# Patient Record
Sex: Female | Born: 1990 | Hispanic: Yes | Marital: Married | Smoking: Never smoker
Health system: Southern US, Community
[De-identification: ages and names within clinical notes are randomized; demographics above are authoritative.]

## PROBLEM LIST (undated history)

## (undated) ENCOUNTER — Inpatient Hospital Stay (HOSPITAL_COMMUNITY): Payer: Self-pay

## (undated) DIAGNOSIS — Z789 Other specified health status: Secondary | ICD-10-CM

## (undated) HISTORY — PX: APPENDECTOMY: SHX54

---

## 2014-01-21 LAB — OB RESULTS CONSOLE ABO/RH: RH TYPE: POSITIVE

## 2014-01-21 LAB — OB RESULTS CONSOLE HEPATITIS B SURFACE ANTIGEN: Hepatitis B Surface Ag: NEGATIVE

## 2014-01-21 LAB — OB RESULTS CONSOLE GC/CHLAMYDIA
CHLAMYDIA, DNA PROBE: NEGATIVE
Gonorrhea: NEGATIVE

## 2014-01-21 LAB — OB RESULTS CONSOLE RPR: RPR: NONREACTIVE

## 2014-01-21 LAB — OB RESULTS CONSOLE RUBELLA ANTIBODY, IGM: RUBELLA: IMMUNE

## 2014-01-21 LAB — OB RESULTS CONSOLE ANTIBODY SCREEN: ANTIBODY SCREEN: NEGATIVE

## 2014-01-21 LAB — OB RESULTS CONSOLE HIV ANTIBODY (ROUTINE TESTING): HIV: NONREACTIVE

## 2014-04-16 NOTE — L&D Delivery Note (Signed)
Delivery Note At 3:03 AM a viable female "Lori Sanford" was delivered via Vaginal, Spontaneous Delivery (Presentation: OA restituting to Left Occiput Anterior).  APGARS: 9, 9; weight pending.   Placenta status: Intact, Spontaneous.  Cord: 3 vessels with the following complications: None.  Cord pH: NA  Anesthesia: Epidural  Episiotomy: None Lacerations: 2nd degree Vaginal Suture Repair: 3.0 vicryl Est. Blood Loss (mL): 500   Mom to postpartum.  Baby to Couplet care / Skin to Skin.  Mom plans to breast and bottlefeed.  Undecided re: circumcision.  Undecided re: birth control.  Continuous trickle noted after repair but not enough for agent other than Pitocin; however, will place on Methergine series x 24 hrs for prophylaxis.  Lori Sanford, Lori Sanford 08/20/2014, 4:04 AM

## 2014-04-21 ENCOUNTER — Encounter (HOSPITAL_COMMUNITY): Payer: Self-pay | Admitting: *Deleted

## 2014-04-21 ENCOUNTER — Inpatient Hospital Stay (HOSPITAL_COMMUNITY)
Admission: AD | Admit: 2014-04-21 | Discharge: 2014-04-21 | Disposition: A | Discharge status: Home / Self Care | Payer: Medicaid Other | Source: Ambulatory Visit | Attending: Obstetrics and Gynecology | Admitting: Obstetrics and Gynecology

## 2014-04-21 DIAGNOSIS — O9989 Other specified diseases and conditions complicating pregnancy, childbirth and the puerperium: Secondary | ICD-10-CM | POA: Diagnosis present

## 2014-04-21 DIAGNOSIS — Z3A21 21 weeks gestation of pregnancy: Secondary | ICD-10-CM | POA: Diagnosis not present

## 2014-04-21 DIAGNOSIS — M545 Low back pain: Secondary | ICD-10-CM | POA: Insufficient documentation

## 2014-04-21 DIAGNOSIS — R51 Headache: Secondary | ICD-10-CM | POA: Diagnosis not present

## 2014-04-21 DIAGNOSIS — N898 Other specified noninflammatory disorders of vagina: Secondary | ICD-10-CM | POA: Diagnosis not present

## 2014-04-21 DIAGNOSIS — G44219 Episodic tension-type headache, not intractable: Secondary | ICD-10-CM | POA: Diagnosis present

## 2014-04-21 LAB — AMNISURE RUPTURE OF MEMBRANE (ROM) NOT AT ARMC: Amnisure ROM: NEGATIVE

## 2014-04-21 MED ORDER — CYCLOBENZAPRINE HCL 10 MG PO TABS: 10.0000 mg | ORAL_TABLET | Freq: Three times a day (TID) | ORAL | Status: DC | PRN

## 2014-04-21 NOTE — MAU Note (Signed)
Regular appt, had one time woke up and panties were wet last week.  Some wetness, but not like last week.  Dr Stefano GaulStringer had called regarding pt.

## 2014-04-21 NOTE — MAU Provider Note (Signed)
History   24 yo [id number] at 4421 1/7 weeks [facility] with report of leaking periodically.  Nitrazene negative, pooling negative, fern positive, sent to MAU for amnisure.  No active leaking at present, no pain.  Does report issue with sporadic frontal HAs and low back pain.  Patient Active Problem List   Diagnosis Date Noted  . Vaginal discharge during pregnancy in second trimester 04/21/2014  . Low back pain during pregnancy 04/21/2014  . Headache, infrequent episodic tension-type 04/21/2014    Chief Complaint  Patient presents with  . Vaginal Discharge   HPI:  See above  OB History    Gravida Para Term Preterm AB TAB SAB Ectopic Multiple Living   4 2 2  1  1   2       History reviewed. No pertinent past medical history.  History reviewed. No pertinent past surgical history.  History reviewed. No pertinent family history.  History  Substance Use Topics  . Smoking status: Never Smoker   . Smokeless tobacco: Not on file  . Alcohol Use: Not on file    Allergies: No Known Allergies  No prescriptions prior to admission    ROS:  Sporadic vaginal  Physical Exam   Blood pressure 109/65, pulse 85, temperature 98.5 F (36.9 C), temperature source Oral, resp. rate 18, weight 180 lb (81.647 kg).  Physical Exam  In NAD Chest clear Heart RRR without murmur Abd gravid, NT Pelvic--deferred.  Amnisure collected Ext WNL  FHR 150 bpm No contractions  Amnisure negative  ED Course  Assessment: IUP at 21 1/7 weeks Vaginal d/c, no evidence SROM Hx HA, low back pain.  Plan: D/C home. Rx Flexeril for tension HA and low back pain. Keep scheduled appt at [facility] in 4 weeks, call prn.   Nigel BridgemanLATHAM, Maddox Bratcher CNM, MSN 04/21/2014 3:04 PM

## 2014-04-21 NOTE — Discharge Instructions (Signed)
Back Pain in Pregnancy °Back pain during pregnancy is common. It happens in about half of all pregnancies. It is important for you and your baby that you remain active during your pregnancy. If you feel that back pain is not allowing you to remain active or sleep well, it is time to see your caregiver. Back pain may be caused by several factors related to changes during your pregnancy. Fortunately, unless you had trouble with your back before your pregnancy, the pain is likely to get better after you deliver. °Low back pain usually occurs between the fifth and seventh months of pregnancy. It can, however, happen in the first couple months. Factors that increase the risk of back problems include:  °· Previous back problems. °· Injury to your back. °· Having twins or multiple births. °· A chronic cough. °· Stress. °· Job-related repetitive motions. °· Muscle or spinal disease in the back. °· Family history of back problems, ruptured (herniated) discs, or osteoporosis. °· Depression, anxiety, and panic attacks. °CAUSES  °· When you are pregnant, your body produces a hormone called relaxin. This hormone makes the ligaments connecting the low back and pubic bones more flexible. This flexibility allows the baby to be delivered more easily. When your ligaments are loose, your muscles need to work harder to support your back. Soreness in your back can come from tired muscles. Soreness can also come from back tissues that are irritated since they are receiving less support. °· As the baby grows, it puts pressure on the nerves and blood vessels in your pelvis. This can cause back pain. °· As the baby grows and gets heavier during pregnancy, the uterus pushes the stomach muscles forward and changes your center of gravity. This makes your back muscles work harder to maintain good posture. °SYMPTOMS  °Lumbar pain during pregnancy °Lumbar pain during pregnancy usually occurs at or above the waist in the center of the back. There  may be pain and numbness that radiates into your leg or foot. This is similar to low back pain experienced by non-pregnant women. It usually increases with sitting for long periods of time, standing, or repetitive lifting. Tenderness may also be present in the muscles along your upper back. °Posterior pelvic pain during pregnancy °Pain in the back of the pelvis is more common than lumbar pain in pregnancy. It is a deep pain felt in your side at the waistline, or across the tailbone (sacrum), or in both places. You may have pain on one or both sides. This pain can also go into the buttocks and backs of the upper thighs. Pubic and groin pain may also be present. The pain does not quickly resolve with rest, and morning stiffness may also be present. °Pelvic pain during pregnancy can be brought on by most activities. A high level of fitness before and during pregnancy may or may not prevent this problem. Labor pain is usually 1 to 2 minutes apart, lasts for about 1 minute, and involves a bearing down feeling or pressure in your pelvis. However, if you are at term with the pregnancy, constant low back pain can be the beginning of early labor, and you should be aware of this. °DIAGNOSIS  °X-rays of the back should not be done during the first 12 to 14 weeks of the pregnancy and only when absolutely necessary during the rest of the pregnancy. MRIs do not give off radiation and are safe during pregnancy. MRIs also should only be done when absolutely necessary. °HOME CARE INSTRUCTIONS °· Exercise   as directed by your caregiver. Exercise is the most effective way to prevent or manage back pain. If you have a back problem, it is especially important to avoid sports that require sudden body movements. Swimming and walking are great activities. °· Do not stand in one place for long periods of time. °· Do not wear high heels. °· Sit in chairs with good posture. Use a pillow on your lower back if necessary. Make sure your head  rests over your shoulders and is not hanging forward. °· Try sleeping on your side, preferably the left side, with a pillow or two between your legs. If you are sore after a night's rest, your bed may be too soft. Try placing a board between your mattress and box spring. °· Listen to your body when lifting. If you are experiencing pain, ask for help or try bending your knees more so you can use your leg muscles rather than your back muscles. Squat down when picking up something from the floor. Do not bend over. °· Eat a healthy diet. Try to gain weight within your caregiver's recommendations. °· Use heat or cold packs 3 to 4 times a day for 15 minutes to help with the pain. °· Only take over-the-counter or prescription medicines for pain, discomfort, or fever as directed by your caregiver. °Sudden (acute) back pain °· Use bed rest for only the most extreme, acute episodes of back pain. Prolonged bed rest over 48 hours will aggravate your condition. °· Ice is very effective for acute conditions. °¨ Put ice in a plastic bag. °¨ Place a towel between your skin and the bag. °¨ Leave the ice on for 10 to 20 minutes every 2 hours, or as needed. °· Using heat packs for 30 minutes prior to activities is also helpful. °Continued back pain °See your caregiver if you have continued problems. Your caregiver can help or refer you for appropriate physical therapy. With conditioning, most back problems can be avoided. Sometimes, a more serious issue may be the cause of back pain. You should be seen right away if new problems seem to be developing. Your caregiver may recommend: °· A maternity girdle. °· An elastic sling. °· A back brace. °· A massage therapist or acupuncture. °SEEK MEDICAL CARE IF:  °· You are not able to do most of your daily activities, even when taking the pain medicine you were given. °· You need a referral to a physical therapist or chiropractor. °· You want to try acupuncture. °SEEK IMMEDIATE MEDICAL CARE  IF: °· You develop numbness, tingling, weakness, or problems with the use of your arms or legs. °· You develop severe back pain that is no longer relieved with medicines. °· You have a sudden change in bowel or bladder control. °· You have increasing pain in other areas of the body. °· You develop shortness of breath, dizziness, or fainting. °· You develop nausea, vomiting, or sweating. °· You have back pain which is similar to labor pains. °· You have back pain along with your water breaking or vaginal bleeding. °· You have back pain or numbness that travels down your leg. °· Your back pain developed after you fell. °· You develop pain on one side of your back. You may have a kidney stone. °· You see blood in your urine. You may have a bladder infection or kidney stone. °· You have back pain with blisters. You may have shingles. °Back pain is fairly common during pregnancy but should not be accepted as just part of   the process. Back pain should always be treated as soon as possible. This will make your pregnancy as pleasant as possible. Document Released: 07/11/2005 Document Revised: 06/25/2011 Document Reviewed: 08/22/2010 [facility] Patient Information 2015 [place], [place]. This information is not intended to replace advice given to you by your health care provider. Make sure you discuss any questions you have with your health care provider.  Tension Headache A tension headache is a feeling of pain, pressure, or aching often felt over the front and sides of the head. The pain can be dull or can feel tight (constricting). It is the most common type of headache. Tension headaches are not normally associated with nausea or vomiting and do not get worse with physical activity. Tension headaches can last 30 minutes to several days.  CAUSES  The exact cause is not known, but it may be caused by chemicals and hormones in the brain that lead to pain. Tension headaches often begin after stress, anxiety, or  depression. Other triggers may include:  Alcohol.  Caffeine (too much or withdrawal).  Respiratory infections (colds, flu, sinus infections).  Dental problems or teeth clenching.  Fatigue.  Holding your head and neck in one position too long while using a computer. SYMPTOMS   Pressure around the head.   Dull, aching head pain.   Pain felt over the front and sides of the head.   Tenderness in the muscles of the head, neck, and shoulders. DIAGNOSIS  A tension headache is often diagnosed based on:   Symptoms.   Physical examination.   A CT scan or MRI of your head. These tests may be ordered if symptoms are severe or unusual. TREATMENT  Medicines may be given to help relieve symptoms.  HOME CARE INSTRUCTIONS   Only take over-the-counter or prescription medicines for pain or discomfort as directed by your caregiver.   Lie down in a dark, quiet room when you have a headache.   Keep a journal to find out what may be triggering your headaches. For example, write down:  What you eat and drink.  How much sleep you get.  Any change to your diet or medicines.  Try massage or other relaxation techniques.   Ice packs or heat applied to the head and neck can be used. Use these 3 to 4 times per day for 15 to 20 minutes each time, or as needed.   Limit stress.   Sit up straight, and do not tense your muscles.   Quit smoking if you smoke.  Limit alcohol use.  Decrease the amount of caffeine you drink, or stop drinking caffeine.  Eat and exercise regularly.  Get 7 to 9 hours of sleep, or as recommended by your caregiver.  Avoid excessive use of pain medicine as recurrent headaches can occur.  SEEK MEDICAL CARE IF:   You have problems with the medicines you were prescribed.  Your medicines do not work.  You have a change from the usual headache.  You have nausea or vomiting. SEEK IMMEDIATE MEDICAL CARE IF:   Your headache becomes severe.  You  have a fever.  You have a stiff neck.  You have loss of vision.  You have muscular weakness or loss of muscle control.  You lose your balance or have trouble walking.  You feel faint or pass out.  You have severe symptoms that are different from your first symptoms. MAKE SURE YOU:   Understand these instructions.  Will watch your condition.  Will get help right  away if you are not doing well or get worse. Document Released: 04/02/2005 Document Revised: 06/25/2011 Document Reviewed: 03/23/2011 [facility] Patient Information 2015 [place]. This information is not intended to replace advice given to you by your health care provider. Make sure you discuss any questions you have with your health care provider.

## 2014-04-21 NOTE — MAU Note (Signed)
Patient urine sent to lab.

## 2014-05-22 ENCOUNTER — Encounter (HOSPITAL_COMMUNITY): Payer: Self-pay

## 2014-05-22 ENCOUNTER — Inpatient Hospital Stay (HOSPITAL_COMMUNITY)
Admission: AD | Admit: 2014-05-22 | Discharge: 2014-05-22 | Disposition: A | Discharge status: Home / Self Care | Payer: Medicaid Other | Source: Ambulatory Visit | Attending: Obstetrics and Gynecology | Admitting: Obstetrics and Gynecology

## 2014-05-22 DIAGNOSIS — Z3A25 25 weeks gestation of pregnancy: Secondary | ICD-10-CM | POA: Diagnosis not present

## 2014-05-22 DIAGNOSIS — N760 Acute vaginitis: Secondary | ICD-10-CM | POA: Insufficient documentation

## 2014-05-22 DIAGNOSIS — O23592 Infection of other part of genital tract in pregnancy, second trimester: Secondary | ICD-10-CM | POA: Insufficient documentation

## 2014-05-22 DIAGNOSIS — M545 Low back pain: Secondary | ICD-10-CM | POA: Diagnosis present

## 2014-05-22 DIAGNOSIS — B9689 Other specified bacterial agents as the cause of diseases classified elsewhere: Secondary | ICD-10-CM | POA: Insufficient documentation

## 2014-05-22 DIAGNOSIS — R109 Unspecified abdominal pain: Secondary | ICD-10-CM | POA: Insufficient documentation

## 2014-05-22 HISTORY — DX: Other specified health status: Z78.9

## 2014-05-22 LAB — URINALYSIS, ROUTINE W REFLEX MICROSCOPIC
Bilirubin Urine: NEGATIVE
Glucose, UA: NEGATIVE mg/dL
Hgb urine dipstick: NEGATIVE
Ketones, ur: NEGATIVE mg/dL
LEUKOCYTES UA: NEGATIVE
Nitrite: NEGATIVE
Protein, ur: NEGATIVE mg/dL
Specific Gravity, Urine: 1.01 (ref 1.005–1.030)
Urobilinogen, UA: 0.2 mg/dL (ref 0.0–1.0)
pH: 6.5 (ref 5.0–8.0)

## 2014-05-22 LAB — FETAL FIBRONECTIN: FETAL FIBRONECTIN: NEGATIVE

## 2014-05-22 LAB — WET PREP, GENITAL
TRICH WET PREP: NONE SEEN
Yeast Wet Prep HPF POC: NONE SEEN

## 2014-05-22 MED ORDER — IBUPROFEN 600 MG PO TABS: 600.0000 mg | ORAL_TABLET | Freq: Four times a day (QID) | ORAL | Status: DC | PRN

## 2014-05-22 MED ORDER — IBUPROFEN 800 MG PO TABS
800.0000 mg | ORAL_TABLET | Freq: Once | ORAL | Status: AC
  Administered 2014-05-22: 800 mg via ORAL
  Filled 2014-05-22: qty 1

## 2014-05-22 MED ORDER — METRONIDAZOLE 500 MG PO TABS: 500.0000 mg | ORAL_TABLET | Freq: Two times a day (BID) | ORAL | Status: AC

## 2014-05-22 NOTE — MAU Note (Signed)
Urine in lab 

## 2014-05-22 NOTE — MAU Note (Signed)
Abd pain and back pain for 3 days. Some bleeding last Sunday but stopped. Leaked some clear fld this am but none since

## 2014-05-22 NOTE — Discharge Instructions (Signed)
Back Pain in Pregnancy °Back pain during pregnancy is common. It happens in about half of all pregnancies. It is important for you and your baby that you remain active during your pregnancy. If you feel that back pain is not allowing you to remain active or sleep well, it is time to see your caregiver. Back pain may be caused by several factors related to changes during your pregnancy. Fortunately, unless you had trouble with your back before your pregnancy, the pain is likely to get better after you deliver. °Low back pain usually occurs between the fifth and seventh months of pregnancy. It can, however, happen in the first couple months. Factors that increase the risk of back problems include:  °· Previous back problems. °· Injury to your back. °· Having twins or multiple births. °· A chronic cough. °· Stress. °· Job-related repetitive motions. °· Muscle or spinal disease in the back. °· Family history of back problems, ruptured (herniated) discs, or osteoporosis. °· Depression, anxiety, and panic attacks. °CAUSES  °· When you are pregnant, your body produces a hormone called relaxin. This hormone makes the ligaments connecting the low back and pubic bones more flexible. This flexibility allows the baby to be delivered more easily. When your ligaments are loose, your muscles need to work harder to support your back. Soreness in your back can come from tired muscles. Soreness can also come from back tissues that are irritated since they are receiving less support. °· As the baby grows, it puts pressure on the nerves and blood vessels in your pelvis. This can cause back pain. °· As the baby grows and gets heavier during pregnancy, the uterus pushes the stomach muscles forward and changes your center of gravity. This makes your back muscles work harder to maintain good posture. °SYMPTOMS  °Lumbar pain during pregnancy °Lumbar pain during pregnancy usually occurs at or above the waist in the center of the back. There  may be pain and numbness that radiates into your leg or foot. This is similar to low back pain experienced by non-pregnant women. It usually increases with sitting for long periods of time, standing, or repetitive lifting. Tenderness may also be present in the muscles along your upper back. °Posterior pelvic pain during pregnancy °Pain in the back of the pelvis is more common than lumbar pain in pregnancy. It is a deep pain felt in your side at the waistline, or across the tailbone (sacrum), or in both places. You may have pain on one or both sides. This pain can also go into the buttocks and backs of the upper thighs. Pubic and groin pain may also be present. The pain does not quickly resolve with rest, and morning stiffness may also be present. °Pelvic pain during pregnancy can be brought on by most activities. A high level of fitness before and during pregnancy may or may not prevent this problem. Labor pain is usually 1 to 2 minutes apart, lasts for about 1 minute, and involves a bearing down feeling or pressure in your pelvis. However, if you are at term with the pregnancy, constant low back pain can be the beginning of early labor, and you should be aware of this. °DIAGNOSIS  °X-rays of the back should not be done during the first 12 to 14 weeks of the pregnancy and only when absolutely necessary during the rest of the pregnancy. MRIs do not give off radiation and are safe during pregnancy. MRIs also should only be done when absolutely necessary. °HOME CARE INSTRUCTIONS °· Exercise   as directed by your caregiver. Exercise is the most effective way to prevent or manage back pain. If you have a back problem, it is especially important to avoid sports that require sudden body movements. Swimming and walking are great activities. °· Do not stand in one place for long periods of time. °· Do not wear high heels. °· Sit in chairs with good posture. Use a pillow on your lower back if necessary. Make sure your head  rests over your shoulders and is not hanging forward. °· Try sleeping on your side, preferably the left side, with a pillow or two between your legs. If you are sore after a night's rest, your bed may be too soft. Try placing a board between your mattress and box spring. °· Listen to your body when lifting. If you are experiencing pain, ask for help or try bending your knees more so you can use your leg muscles rather than your back muscles. Squat down when picking up something from the floor. Do not bend over. °· Eat a healthy diet. Try to gain weight within your caregiver's recommendations. °· Use heat or cold packs 3 to 4 times a day for 15 minutes to help with the pain. °· Only take over-the-counter or prescription medicines for pain, discomfort, or fever as directed by your caregiver. °Sudden (acute) back pain °· Use bed rest for only the most extreme, acute episodes of back pain. Prolonged bed rest over 48 hours will aggravate your condition. °· Ice is very effective for acute conditions. °¨ Put ice in a plastic bag. °¨ Place a towel between your skin and the bag. °¨ Leave the ice on for 10 to 20 minutes every 2 hours, or as needed. °· Using heat packs for 30 minutes prior to activities is also helpful. °Continued back pain °See your caregiver if you have continued problems. Your caregiver can help or refer you for appropriate physical therapy. With conditioning, most back problems can be avoided. Sometimes, a more serious issue may be the cause of back pain. You should be seen right away if new problems seem to be developing. Your caregiver may recommend: °· A maternity girdle. °· An elastic sling. °· A back brace. °· A massage therapist or acupuncture. °SEEK MEDICAL CARE IF:  °· You are not able to do most of your daily activities, even when taking the pain medicine you were given. °· You need a referral to a physical therapist or chiropractor. °· You want to try acupuncture. °SEEK IMMEDIATE MEDICAL CARE  IF: °· You develop numbness, tingling, weakness, or problems with the use of your arms or legs. °· You develop severe back pain that is no longer relieved with medicines. °· You have a sudden change in bowel or bladder control. °· You have increasing pain in other areas of the body. °· You develop shortness of breath, dizziness, or fainting. °· You develop nausea, vomiting, or sweating. °· You have back pain which is similar to labor pains. °· You have back pain along with your water breaking or vaginal bleeding. °· You have back pain or numbness that travels down your leg. °· Your back pain developed after you fell. °· You develop pain on one side of your back. You may have a kidney stone. °· You see blood in your urine. You may have a bladder infection or kidney stone. °· You have back pain with blisters. You may have shingles. °Back pain is fairly common during pregnancy but should not be accepted as just part of   the process. Back pain should always be treated as soon as possible. This will make your pregnancy as pleasant as possible. Document Released: 07/11/2005 Document Revised: 06/25/2011 Document Reviewed: 08/22/2010 [facility] Patient Information 2015 [place], [place]. This information is not intended to replace advice given to you by your health care provider. Make sure you discuss any questions you have with your health care provider. Abdominal Pain During Pregnancy Abdominal pain is common in pregnancy. Most of the time, it does not cause harm. There are many causes of abdominal pain. Some causes are more serious than others. Some of the causes of abdominal pain in pregnancy are easily diagnosed. Occasionally, the diagnosis takes time to understand. Other times, the cause is not determined. Abdominal pain can be a sign that something is very wrong with the pregnancy, or the pain may have nothing to do with the pregnancy at all. For this reason, always tell your health care provider if you have any  abdominal discomfort. HOME CARE INSTRUCTIONS  Monitor your abdominal pain for any changes. The following actions may help to alleviate any discomfort you are experiencing:  Do not have sexual intercourse or put anything in your vagina until your symptoms go away completely.  Get plenty of rest until your pain improves.  Drink clear fluids if you feel nauseous. Avoid solid food as long as you are uncomfortable or nauseous.  Only take over-the-counter or prescription medicine as directed by your health care provider.  Keep all follow-up appointments with your health care provider. SEEK IMMEDIATE MEDICAL CARE IF:  You are bleeding, leaking fluid, or passing tissue from the vagina.  You have increasing pain or cramping.  You have persistent vomiting.  You have painful or bloody urination.  You have a fever.  You notice a decrease in your baby's movements.  You have extreme weakness or feel faint.  You have shortness of breath, with or without abdominal pain.  You develop a severe headache with abdominal pain.  You have abnormal vaginal discharge with abdominal pain.  You have persistent diarrhea.  You have abdominal pain that continues even after rest, or gets worse. MAKE SURE YOU:   Understand these instructions.  Will watch your condition.  Will get help right away if you are not doing well or get worse. Document Released: 04/02/2005 Document Revised: 01/21/2013 Document Reviewed: 10/30/2012 [facility] Patient Information 2015 [place], [place]. This information is not intended to replace advice given to you by your health care provider. Make sure you discuss any questions you have with your health care provider. Bacterial Vaginosis Bacterial vaginosis is a vaginal infection that occurs when the normal balance of bacteria in the vagina is disrupted. It results from an overgrowth of certain bacteria. This is the most common vaginal infection in women of childbearing age.  Treatment is important to prevent complications, especially in pregnant women, as it can cause a premature delivery. CAUSES  Bacterial vaginosis is caused by an increase in harmful bacteria that are normally present in smaller amounts in the vagina. Several different kinds of bacteria can cause bacterial vaginosis. However, the reason that the condition develops is not fully understood. RISK FACTORS Certain activities or behaviors can put you at an increased risk of developing bacterial vaginosis, including:  Having a new sex partner or multiple sex partners.  Douching.  Using an intrauterine device (IUD) for contraception. Women do not get bacterial vaginosis from toilet seats, bedding, swimming pools, or contact with objects around them. SIGNS AND SYMPTOMS  Some women  with bacterial vaginosis have no signs or symptoms. Common symptoms include:  Grey vaginal discharge.  A fishlike odor with discharge, especially after sexual intercourse.  Itching or burning of the vagina and vulva.  Burning or pain with urination. DIAGNOSIS  Your health care provider will take a medical history and examine the vagina for signs of bacterial vaginosis. A sample of vaginal fluid may be taken. Your health care provider will look at this sample under a microscope to check for bacteria and abnormal cells. A vaginal pH test may also be done.  TREATMENT  Bacterial vaginosis may be treated with antibiotic medicines. These may be given in the form of a pill or a vaginal cream. A second round of antibiotics may be prescribed if the condition comes back after treatment.  HOME CARE INSTRUCTIONS   Only take over-the-counter or prescription medicines as directed by your health care provider.  If antibiotic medicine was prescribed, take it as directed. Make sure you finish it even if you start to feel better.  Do not have sex until treatment is completed.  Tell all sexual partners that you have a vaginal  infection. They should see their health care provider and be treated if they have problems, such as a mild rash or itching.  Practice safe sex by using condoms and only having one sex partner. SEEK MEDICAL CARE IF:   Your symptoms are not improving after 3 days of treatment.  You have increased discharge or pain.  You have a fever. MAKE SURE YOU:   Understand these instructions.  Will watch your condition.  Will get help right away if you are not doing well or get worse. FOR MORE INFORMATION  Centers for Disease Control and Prevention, Division of STD Prevention: SolutionApps.co.zawww.cdc.gov/std American Sexual Health Association (ASHA): www.ashastd.org  Document Released: 04/02/2005 Document Revised: 01/21/2013 Document Reviewed: 11/12/2012 [facility] Patient Information 2015 [place]. This information is not intended to replace advice given to you by your health care provider. Make sure you discuss any questions you have with your health care provider.

## 2014-05-22 NOTE — MAU Provider Note (Signed)
History  Lori Sanford is a 24 yo [id number] @ 25.4 wks who presents to MAU after calling w/ c/o low back pain and intermittent low abdominal tightening, especially when sitting and walking. Reports increase in discharge w/o odor and good FM. Denies VB. Last intercourse 1 week ago. Has struggled with back pain since onset of pregnancy. Was prescribed exercises for low back pain in pregnancy and Flexeril which she states is not working. Patient Active Problem List   Diagnosis Date Noted  . Abdominal pain 05/22/2014  . Vaginal discharge during pregnancy in second trimester 04/21/2014  . Low back pain during pregnancy 04/21/2014  . Headache, infrequent episodic tension-type 04/21/2014    Chief Complaint  Patient presents with  . Abdominal Pain  . Back Pain   HPI As above OB History    Gravida Para Term Preterm AB TAB SAB Ectopic Multiple Living   4 2 2  1  1   2       Past Medical History  Diagnosis Date  . Medical history non-contributory     Past Surgical History  Procedure Laterality Date  . No past surgeries      Family History  Problem Relation Age of Onset  . Stroke Mother   . Hypertension Mother   . Diabetes Father     History  Substance Use Topics  . Smoking status: Never Smoker   . Smokeless tobacco: Never Used  . Alcohol Use: No    Allergies: No Known Allergies  Prescriptions prior to admission  Medication Sig Dispense Refill Last Dose  . Prenatal Vit-Fe Fumarate-FA (PRENATAL MULTIVITAMIN) TABS tablet Take 1 tablet by mouth daily at 12 noon.   05/21/2014 at Unknown time  . cyclobenzaprine (FLEXERIL) 10 MG tablet Take 1 tablet (10 mg total) by mouth 3 (three) times daily as needed for muscle spasms. 30 tablet 0     ROS  Abdominal pain Back pain Physical Exam   Results for orders placed or performed during the hospital encounter of 05/22/14 (from the past 24 hour(s))  Urinalysis, Routine w reflex microscopic     Status: None   Collection Time: 05/22/14  12:30 AM  Result Value Ref Range   Color, Urine YELLOW YELLOW   APPearance CLEAR CLEAR   Specific Gravity, Urine 1.010 1.005 - 1.030   pH 6.5 5.0 - 8.0   Glucose, UA NEGATIVE NEGATIVE mg/dL   Hgb urine dipstick NEGATIVE NEGATIVE   Bilirubin Urine NEGATIVE NEGATIVE   Ketones, ur NEGATIVE NEGATIVE mg/dL   Protein, ur NEGATIVE NEGATIVE mg/dL   Urobilinogen, UA 0.2 0.0 - 1.0 mg/dL   Nitrite NEGATIVE NEGATIVE   Leukocytes, UA NEGATIVE NEGATIVE  Fetal fibronectin     Status: None   Collection Time: 05/22/14  1:08 AM  Result Value Ref Range   Fetal Fibronectin NEGATIVE NEGATIVE  Wet prep, genital     Status: Abnormal   Collection Time: 05/22/14  1:08 AM  Result Value Ref Range   Yeast Wet Prep HPF POC NONE SEEN NONE SEEN   Trich, Wet Prep NONE SEEN NONE SEEN   Clue Cells Wet Prep HPF POC FEW (A) NONE SEEN   WBC, Wet Prep HPF POC MODERATE (A) NONE SEEN   Blood pressure 113/67, pulse 78, temperature 97.6 F (36.4 C), resp. rate 18, height 5\' 4"  (1.626 m), weight 189 lb 3.2 oz (85.821 kg).    Physical Exam Gen: NAD Abd: gravid, soft, non-tender, no guarding or rebound tenderness Neg CVAT bilaterally Pelvic: Cvx long/closed/out of  pelvis/posterior Ext: WNL FHRT: Reassuring for this GA  U/Cs: None ED Course  UA fFN Wet prep  Assessment: Low back pain. R/O infectious vaginitis.  Plan: Wet prep fFN Ibuprofen while awaiting results   Sherre Scarlet CNM, MS 05/22/2014 1:48 AM   ADDENDUM: A: BV and low back pain. Common discomforts of pregnancy along with their relief measures reviewed.  No concerns for PTL at present.  P: 1) Metronidazole      2) Keep office appt on 06/02/14     3) Encouraged exercises as instructed on a regular basis, trial of Ibuprofen Rx'd as pt reported relief   Sherre Scarlet, CNM 05/22/2014, 1:55 AM

## 2014-06-13 ENCOUNTER — Inpatient Hospital Stay (HOSPITAL_COMMUNITY)
Admission: AD | Admit: 2014-06-13 | Discharge: 2014-06-14 | Disposition: A | Discharge status: Home / Self Care | Payer: Medicaid Other | Source: Ambulatory Visit | Attending: Obstetrics and Gynecology | Admitting: Obstetrics and Gynecology

## 2014-06-13 ENCOUNTER — Encounter (HOSPITAL_COMMUNITY): Payer: Self-pay | Admitting: *Deleted

## 2014-06-13 DIAGNOSIS — B373 Candidiasis of vulva and vagina: Secondary | ICD-10-CM | POA: Insufficient documentation

## 2014-06-13 DIAGNOSIS — O98813 Other maternal infectious and parasitic diseases complicating pregnancy, third trimester: Secondary | ICD-10-CM | POA: Insufficient documentation

## 2014-06-13 DIAGNOSIS — Z3A28 28 weeks gestation of pregnancy: Secondary | ICD-10-CM | POA: Diagnosis not present

## 2014-06-13 DIAGNOSIS — O9989 Other specified diseases and conditions complicating pregnancy, childbirth and the puerperium: Secondary | ICD-10-CM | POA: Diagnosis present

## 2014-06-13 LAB — URINALYSIS, ROUTINE W REFLEX MICROSCOPIC
BILIRUBIN URINE: NEGATIVE
Glucose, UA: NEGATIVE mg/dL
Ketones, ur: NEGATIVE mg/dL
NITRITE: NEGATIVE
PH: 6 (ref 5.0–8.0)
Protein, ur: NEGATIVE mg/dL
SPECIFIC GRAVITY, URINE: 1.02 (ref 1.005–1.030)
UROBILINOGEN UA: 0.2 mg/dL (ref 0.0–1.0)

## 2014-06-13 LAB — URINE MICROSCOPIC-ADD ON

## 2014-06-13 LAB — AMNISURE RUPTURE OF MEMBRANE (ROM) NOT AT ARMC: Amnisure ROM: NEGATIVE

## 2014-06-13 NOTE — MAU Provider Note (Signed)
  History     CSN: [id number]  Arrival date and time: 06/13/14 2000   First Provider Initiated Contact with Patient 06/13/14 [id number]      Chief Complaint  Patient presents with  . Rupture of Membranes    rule out    HPI 24 y/o G4 P2012 @ 28 5/7 weeks presented with c/o Leakage of fluid since yesterday. Pt states she has ~ 3 episodes of fluid from the vagina leaking through panties in the last 36 hours.  Pt denies contractions, bleeding, foul vaginal odor or itching.  She states she does have burning on the vulva when she urinates.  She is pt of [facility].  States her prenatal care has been uncomplicated.  No vaginal infections this pregnancy.  Pt has had 2 full term deliveries, no  Procedures on cervix b/w the last and current pregnancy.    Past Medical History  Diagnosis Date  . Medical history non-contributory     Past Surgical History  Procedure Laterality Date  . No past surgeries      Family History  Problem Relation Age of Onset  . Stroke Mother   . Hypertension Mother   . Diabetes Father     History  Substance Use Topics  . Smoking status: Never Smoker   . Smokeless tobacco: Never Used  . Alcohol Use: No    Allergies: No Known Allergies  Prescriptions prior to admission  Medication Sig Dispense Refill Last Dose  . ibuprofen (ADVIL,MOTRIN) 600 MG tablet Take 1 tablet (600 mg total) by mouth every 6 (six) hours as needed. 20 tablet 0   . Prenatal Vit-Fe Fumarate-FA (PRENATAL MULTIVITAMIN) TABS tablet Take 1 tablet by mouth daily at 12 noon.   05/21/2014 at Unknown time    ROS Physical Exam   Blood pressure 119/73, pulse 93, temperature 98.3 F (36.8 C), temperature source Oral, resp. rate 18, height 5\' 3"  (1.6 m), weight 87.227 kg (192 lb 4.8 oz).  Physical Exam  Constitutional: She appears well-developed and well-nourished. No distress.  HENT:  Head: Normocephalic and atraumatic.  Respiratory: Effort normal.  GI: Soft. There is no tenderness.  Genitourinary:  There is rash on the right labia. There is rash on the left labia. Vaginal discharge found.  Thick yellow yeast in vault.  No pooling.  No LOF with Valsalva.  Pt with mild discomfort during SVE. Cervix-internal os closed, external os multiparous, firm posterior, thick.    MAU Course  Procedures Amnisure negative  MDM Wet prep and GC/Chl neg.  Assessment and Plan  IUP at 28 5/7 weeks Intact membranes. Vulvovaginitis due to candida. Fetal status reassuring.  Discharge home on Terazol 3. Await wet prep to assess for BV. Will inform CNM to f/u on GC/Chl. If negative BV, send home to f/u with [facility] at previously scheduled time.  Geryl RankinsVARNADO, Neema Barreira 06/13/2014, 11:55 PM

## 2014-06-13 NOTE — MAU Note (Signed)
Noticed leaking yesterday morning.  Then noticed another gush of fluids last night.  Called and spoke with ob office and was instructed to put on a pad.  Had intercourse this morning.  Noticed another gush of fluid and increased in white colored discharge.  Denies any vaginal bleeding or cramping

## 2014-06-14 LAB — WET PREP, GENITAL
TRICH WET PREP: NONE SEEN
Yeast Wet Prep HPF POC: NONE SEEN

## 2014-06-14 MED ORDER — METRONIDAZOLE 500 MG PO TABS: 500.0000 mg | ORAL_TABLET | Freq: Two times a day (BID) | ORAL | Status: DC

## 2014-06-14 MED ORDER — TERCONAZOLE 80 MG VA SUPP: 80.0000 mg | Freq: Every day | VAGINAL | Status: DC

## 2014-06-14 NOTE — Discharge Instructions (Signed)
Candida Infection A Candida infection (also called yeast, fungus, and Monilia infection) is an overgrowth of yeast that can occur anywhere on the body. A yeast infection commonly occurs in warm, moist body areas. Usually, the infection remains localized but can spread to become a systemic infection. A yeast infection may be a sign of a more severe disease such as diabetes, leukemia, or AIDS. A yeast infection can occur in both men and women. In women, Candida vaginitis is a vaginal infection. It is one of the most common causes of vaginitis. Men usually do not have symptoms or know they have an infection until other problems develop. Men may find out they have a yeast infection because their sex partner has a yeast infection. Uncircumcised men are more likely to get a yeast infection than circumcised men. This is because the uncircumcised glans is not exposed to air and does not remain as dry as that of a circumcised glans. Older adults may develop yeast infections around dentures. CAUSES  Women  Antibiotics.  Steroid medication taken for a long time.  Being overweight (obese).  Diabetes.  Poor immune condition.  Certain serious medical conditions.  Immune suppressive medications for organ transplant patients.  Chemotherapy.  Pregnancy.  Menstruation.  Stress and fatigue.  Intravenous drug use.  Oral contraceptives.  Wearing tight-fitting clothes in the crotch area.  Catching it from a sex partner who has a yeast infection.  Spermicide.  Intravenous, urinary, or other catheters. Men  Catching it from a sex partner who has a yeast infection.  Having oral or anal sex with a person who has the infection.  Spermicide.  Diabetes.  Antibiotics.  Poor immune system.  Medications that suppress the immune system.  Intravenous drug use.  Intravenous, urinary, or other catheters. SYMPTOMS  Women  Thick, white vaginal discharge.  Vaginal itching.  Redness and  swelling in and around the vagina.  Irritation of the lips of the vagina and perineum.  Blisters on the vaginal lips and perineum.  Painful sexual intercourse.  Low blood sugar (hypoglycemia).  Painful urination.  Bladder infections.  Intestinal problems such as constipation, indigestion, bad breath, bloating, increase in gas, diarrhea, or loose stools. Men  Men may develop intestinal problems such as constipation, indigestion, bad breath, bloating, increase in gas, diarrhea, or loose stools.  Dry, cracked skin on the penis with itching or discomfort.  Jock itch.  Dry, flaky skin.  Athlete's foot.  Hypoglycemia. DIAGNOSIS  Women  A history and an exam are performed.  The discharge may be examined under a microscope.  A culture may be taken of the discharge. Men  A history and an exam are performed.  Any discharge from the penis or areas of cracked skin will be looked at under the microscope and cultured.  Stool samples may be cultured. TREATMENT  Women  Vaginal antifungal suppositories and creams.  Medicated creams to decrease irritation and itching on the outside of the vagina.  Warm compresses to the perineal area to decrease swelling and discomfort.  Oral antifungal medications.  Medicated vaginal suppositories or cream for repeated or recurrent infections.  Wash and dry the irritation areas before applying the cream.  Eating yogurt with Lactobacillus may help with prevention and treatment.  Sometimes painting the vagina with gentian violet solution may help if creams and suppositories do not work. Men  Antifungal creams and oral antifungal medications.  Sometimes treatment must continue for 30 days after the symptoms go away to prevent recurrence. HOME CARE INSTRUCTIONS  Women  Use cotton underwear and avoid tight-fitting clothing.  Avoid colored, scented toilet paper and deodorant tampons or pads.  Do not douche.  Keep your diabetes  under control.  Finish all the prescribed medications.  Keep your skin clean and dry.  Consume milk or yogurt with Lactobacillus-active culture regularly. If you get frequent yeast infections and think that is what the infection is, there are over-the-counter medications that you can get. If the infection does not show healing in 3 days, talk to your caregiver.  Tell your sex partner you have a yeast infection. Your partner may need treatment also, especially if your infection does not clear up or recurs. Men  Keep your skin clean and dry.  Keep your diabetes under control.  Finish all prescribed medications.  Tell your sex partner that you have a yeast infection so he or she can be treated if necessary. SEEK MEDICAL CARE IF:   Your symptoms do not clear up or worsen in one week after treatment.  You have an oral temperature above 102 F (38.9 C).  You have trouble swallowing or eating for a prolonged time.  You develop blisters on and around your vagina.  You develop vaginal bleeding and it is not your menstrual period.  You develop abdominal pain.  You develop intestinal problems as mentioned above.  You get weak or light-headed.  You have painful or increased urination.  You have pain during sexual intercourse. MAKE SURE YOU:   Understand these instructions.  Will watch your condition.  Will get help right away if you are not doing well or get worse. Document Released: 05/10/2004 Document Revised: 08/17/2013 Document Reviewed: 08/22/2009 [facility] Patient Information 2015 [place] Heart[place]Care, [place]LL[place]. This information is not intended to replace advice given to you by your health care provider. Make sure you discuss any questions you have with your health care provider. Bacterial Vaginosis Bacterial vaginosis is an infection of the vagina. It happens when too many of certain germs (bacteria) grow in the vagina. HOME CARE  Take your medicine as told by your doctor.  Finish  your medicine even if you start to feel better.  Do not have sex until you finish your medicine and are better.  Tell your sex partner that you have an infection. They should see their doctor for treatment.  Practice safe sex. Use condoms. Have only one sex partner. GET HELP IF:  You are not getting better after 3 days of treatment.  You have more grey fluid (discharge) coming from your vagina than before.  You have more pain than before.  You have a fever. MAKE SURE YOU:   Understand these instructions.  Will watch your condition.  Will get help right away if you are not doing well or get worse. Document Released: 01/10/2008 Document Revised: 01/21/2013 Document Reviewed: 11/12/2012 [facility] Patient Information 2015 [place]ExitCare, [place]. This information is not intended to replace advice given to you by your health care provider. Make sure you discuss any questions you have with your health care provider.

## 2014-06-15 LAB — GC/CHLAMYDIA PROBE AMP (~~LOC~~) NOT AT ARMC
Chlamydia: NEGATIVE
Neisseria Gonorrhea: NEGATIVE

## 2014-07-26 LAB — OB RESULTS CONSOLE GBS: GBS: POSITIVE

## 2014-08-19 ENCOUNTER — Encounter (HOSPITAL_COMMUNITY): Payer: Self-pay | Admitting: *Deleted

## 2014-08-19 ENCOUNTER — Inpatient Hospital Stay (HOSPITAL_COMMUNITY)
Admission: AD | Admit: 2014-08-19 | Discharge: 2014-08-21 | Disposition: A | Discharge status: Home / Self Care | Payer: Medicaid Other | Source: Ambulatory Visit | Attending: Obstetrics and Gynecology | Admitting: Obstetrics and Gynecology

## 2014-08-19 ENCOUNTER — Inpatient Hospital Stay (HOSPITAL_COMMUNITY)
Admission: AD | Admit: 2014-08-19 | Discharge: 2014-08-19 | DRG: 775 | Disposition: A | Discharge status: Home / Self Care | Payer: Medicaid Other | Source: Ambulatory Visit | Attending: Obstetrics and Gynecology | Admitting: Obstetrics and Gynecology

## 2014-08-19 DIAGNOSIS — D649 Anemia, unspecified: Secondary | ICD-10-CM | POA: Diagnosis not present

## 2014-08-19 DIAGNOSIS — O3663X0 Maternal care for excessive fetal growth, third trimester, not applicable or unspecified: Secondary | ICD-10-CM | POA: Diagnosis present

## 2014-08-19 DIAGNOSIS — IMO0002 Reserved for concepts with insufficient information to code with codable children: Secondary | ICD-10-CM | POA: Diagnosis present

## 2014-08-19 DIAGNOSIS — O9081 Anemia of the puerperium: Secondary | ICD-10-CM | POA: Diagnosis not present

## 2014-08-19 DIAGNOSIS — O99824 Streptococcus B carrier state complicating childbirth: Secondary | ICD-10-CM | POA: Diagnosis present

## 2014-08-19 DIAGNOSIS — Z3A38 38 weeks gestation of pregnancy: Secondary | ICD-10-CM | POA: Diagnosis present

## 2014-08-19 DIAGNOSIS — B951 Streptococcus, group B, as the cause of diseases classified elsewhere: Secondary | ICD-10-CM | POA: Diagnosis present

## 2014-08-19 DIAGNOSIS — N858 Other specified noninflammatory disorders of uterus: Secondary | ICD-10-CM | POA: Diagnosis present

## 2014-08-19 LAB — CBC
HCT: 34.9 % — ABNORMAL LOW (ref 36.0–46.0)
HEMOGLOBIN: 12.1 g/dL (ref 12.0–15.0)
MCH: 30.6 pg (ref 26.0–34.0)
MCHC: 34.7 g/dL (ref 30.0–36.0)
MCV: 88.1 fL (ref 78.0–100.0)
Platelets: 200 10*3/uL (ref 150–400)
RBC: 3.96 MIL/uL (ref 3.87–5.11)
RDW: 13.6 % (ref 11.5–15.5)
WBC: 8.8 10*3/uL (ref 4.0–10.5)

## 2014-08-19 LAB — RPR: RPR Ser Ql: NONREACTIVE

## 2014-08-19 LAB — HIV ANTIBODY (ROUTINE TESTING W REFLEX): HIV SCREEN 4TH GENERATION: NONREACTIVE

## 2014-08-19 MED ORDER — OXYCODONE-ACETAMINOPHEN 5-325 MG PO TABS: 2.0000 | ORAL_TABLET | ORAL | Status: DC | PRN

## 2014-08-19 MED ORDER — OXYCODONE-ACETAMINOPHEN 5-325 MG PO TABS: 1.0000 | ORAL_TABLET | ORAL | Status: DC | PRN

## 2014-08-19 MED ORDER — NALBUPHINE HCL 10 MG/ML IJ SOLN: 10.0000 mg | INTRAMUSCULAR | Status: DC | PRN

## 2014-08-19 MED ORDER — ACETAMINOPHEN 325 MG PO TABS: 650.0000 mg | ORAL_TABLET | ORAL | Status: DC | PRN

## 2014-08-19 MED ORDER — LACTATED RINGERS IV SOLN
INTRAVENOUS | Status: DC
  Administered 2014-08-19 – 2014-08-20 (×2): via INTRAVENOUS

## 2014-08-19 MED ORDER — FLEET ENEMA 7-19 GM/118ML RE ENEM: 1.0000 | ENEMA | RECTAL | Status: DC | PRN

## 2014-08-19 MED ORDER — OXYTOCIN 40 UNITS IN LACTATED RINGERS INFUSION - SIMPLE MED: 62.5000 mL/h | INTRAVENOUS | Status: DC

## 2014-08-19 MED ORDER — LACTATED RINGERS IV SOLN: 500.0000 mL | INTRAVENOUS | Status: DC | PRN

## 2014-08-19 MED ORDER — TERBUTALINE SULFATE 1 MG/ML IJ SOLN: 0.2500 mg | Freq: Once | INTRAMUSCULAR | Status: AC | PRN

## 2014-08-19 MED ORDER — PENICILLIN G POTASSIUM 5000000 UNITS IJ SOLR
5.0000 10*6.[IU] | Freq: Once | INTRAVENOUS | Status: AC
  Administered 2014-08-19: 5 10*6.[IU] via INTRAVENOUS
  Filled 2014-08-19: qty 5

## 2014-08-19 MED ORDER — PENICILLIN G POTASSIUM 5000000 UNITS IJ SOLR
2.5000 10*6.[IU] | INTRAVENOUS | Status: DC
  Administered 2014-08-20: 2.5 10*6.[IU] via INTRAVENOUS
  Filled 2014-08-19 (×5): qty 2.5

## 2014-08-19 MED ORDER — OXYTOCIN BOLUS FROM INFUSION: 500.0000 mL | INTRAVENOUS | Status: DC

## 2014-08-19 MED ORDER — OXYTOCIN BOLUS FROM INFUSION
500.0000 mL | INTRAVENOUS | Status: DC
  Administered 2014-08-20: 500 mL via INTRAVENOUS

## 2014-08-19 MED ORDER — CITRIC ACID-SODIUM CITRATE 334-500 MG/5ML PO SOLN: 30.0000 mL | ORAL | Status: DC | PRN

## 2014-08-19 MED ORDER — BUTORPHANOL TARTRATE 1 MG/ML IJ SOLN
1.0000 mg | INTRAMUSCULAR | Status: DC | PRN
  Administered 2014-08-19: 1 mg via INTRAVENOUS
  Filled 2014-08-19: qty 1

## 2014-08-19 MED ORDER — DEXTROSE 5 % IV SOLN
5.0000 10*6.[IU] | Freq: Once | INTRAVENOUS | Status: AC
  Administered 2014-08-19: 5 10*6.[IU] via INTRAVENOUS
  Filled 2014-08-19: qty 5

## 2014-08-19 MED ORDER — ONDANSETRON HCL 4 MG/2ML IJ SOLN: 4.0000 mg | Freq: Four times a day (QID) | INTRAMUSCULAR | Status: DC | PRN

## 2014-08-19 MED ORDER — PENICILLIN G POTASSIUM 5000000 UNITS IJ SOLR
2.5000 10*6.[IU] | INTRAVENOUS | Status: DC
  Filled 2014-08-19: qty 2.5

## 2014-08-19 MED ORDER — LIDOCAINE HCL (PF) 1 % IJ SOLN
30.0000 mL | INTRAMUSCULAR | Status: DC | PRN
Start: 2014-08-19 — End: 2014-08-19

## 2014-08-19 MED ORDER — LIDOCAINE HCL (PF) 1 % IJ SOLN
30.0000 mL | INTRAMUSCULAR | Status: DC | PRN
  Filled 2014-08-19: qty 30

## 2014-08-19 MED ORDER — OXYTOCIN 40 UNITS IN LACTATED RINGERS INFUSION - SIMPLE MED
1.0000 m[IU]/min | INTRAVENOUS | Status: DC
Start: 2014-08-19 — End: 2014-08-20

## 2014-08-19 MED ORDER — LACTATED RINGERS IV SOLN
INTRAVENOUS | Status: DC
  Administered 2014-08-19: 10:00:00 via INTRAVENOUS

## 2014-08-19 MED ORDER — OXYTOCIN 40 UNITS IN LACTATED RINGERS INFUSION - SIMPLE MED
62.5000 mL/h | INTRAVENOUS | Status: DC
  Filled 2014-08-19: qty 1000

## 2014-08-19 NOTE — MAU Note (Signed)
Brownish d/c noted when woke up from nap.  Decreased fetal movement noted.

## 2014-08-19 NOTE — MAU Note (Signed)
Perineum dry, pt states has felt nothing since cleaned up.

## 2014-08-19 NOTE — MAU Note (Signed)
"   something is coming out", wetness noted on perineum. Gel noted when went to obtain fern slide. Peri care provided, will continue to watch

## 2014-08-19 NOTE — Progress Notes (Signed)
  Subjective: Coping well with UCs, SROM at 2245, moderate MSF, more uncomfortable with UCs.  Declines pain med at present--hoping to avoid epidural.  Objective: BP 127/76 mmHg  Pulse 98  Temp(Src) 98 F (36.7 C) (Oral)  Resp 18  Ht 5\' 4"  (1.626 m)  Wt 93.441 kg (206 lb)  BMI 35.34 kg/m2  SpO2 100%    Received 1st dose PCN at 2101  FHT: Category 1 UC:   irregular, every 2-5 minutes SVE:   Dilation: 5.5 Effacement (%): 70 Station: -1 Exam by:: Lori ArchV. Lori Sanford, CNM  EFW approx 8 lbs by [place]. Leaking moderate MSF  Assessment:  Early labor GBS positive Possible LGA  Plan: Continue observation at present. Augment prn. Pain med/epidural prn.  Lori Sanford, Lori Sanford CNM 08/19/2014, 11:29 PM

## 2014-08-19 NOTE — Discharge Instructions (Signed)
Tercer trimestre de Media planner (Third Trimester of Pregnancy) El tercer trimestre va desde la semana29 hasta la 92, desde el sptimo hasta el noveno mes, y es la poca en la que el feto crece ms rpidamente. Hacia el final del noveno mes, el feto mide alrededor de 20pulgadas (45cm) de largo y pesa entre 6 y 68 libras (2,700 y 57,500kg).  CAMBIOS EN EL ORGANISMO Su organismo atraviesa por muchos cambios durante el [place], y estos varan de Ardelia Mems mujer a Theatre manager.   Seguir [employer]. Es de esperar que aumente entre 25 y 35libras (64 y 16kg) hacia el final del Media planner.  Podrn aparecer las primeras [employer] caderas, el abdomen y las [place].  Puede tener necesidad de Garment/textile technologist con ms frecuencia porque el feto baja hacia la pelvis y ejerce presin sobre la vejiga.  Debido al Glennis Brink podr sentir Victorio Palm estomacal con frecuencia.  Puede estar estreida, ya que ciertas hormonas enlentecen los movimientos de los msculos que [employer] desechos a travs de los intestinos.  Pueden aparecer hemorroides o abultarse e hincharse las venas (venas varicosas).  Puede sentir dolor plvico debido al [employer] y a que las hormonas del Scientist, research (life sciences) las articulaciones entre los huesos de la pelvis. El dolor de espalda puede ser consecuencia de la sobrecarga de los msculos que soportan la [place].  Tal vez haya cambios en el cabello que pueden incluir su engrosamiento, crecimiento rpido y cambios en la textura. Adems, a algunas mujeres se les cae el cabello durante o despus del embarazo, o tienen el cabello seco o fino. Lo ms probable es que el cabello se le normalice despus del nacimiento del beb.  Las [employer] seguirn creciendo y Teaching laboratory technician. A veces, puede haber una secrecin amarilla de las mamas llamada calostro.  El ombligo puede salir hacia afuera.  Puede sentir que le falta el aire debido a que se expande el tero.  Puede notar que el feto "baja" o lo siente ms bajo, en el  abdomen.  Puede tener una prdida de secrecin mucosa con sangre. Esto suele ocurrir en el trmino de unos pocos das a una semana antes de que comience el [place].  El cuello del tero se vuelve delgado y blando (se borra) cerca de la fecha de [place]. QU DEBE ESPERAR EN LOS EXMENES PRENATALES  Le harn exmenes prenatales cada 2semanas hasta la semana36. A partir de ese momento le harn exmenes semanales. Durante una visita prenatal de rutina:  La pesarn para asegurarse de que usted y el feto estn creciendo normalmente.  Le tomarn la presin arterial.  Le medirn el abdomen para controlar el desarrollo del beb.  Se escucharn los latidos cardacos fetales.  Se evaluarn los resultados de los estudios solicitados en visitas anteriores.  Le revisarn el cuello del tero cuando est prxima la fecha de parto para controlar si este se ha borrado. Alrededor de la semana36, el mdico le revisar el cuello del tero. Al mismo tiempo, realizar un anlisis de las secreciones del tejido vaginal. Este examen es para determinar si hay un tipo de bacteria, estreptococo Grupo B. El mdico le explicar esto con ms detalle. El mdico puede preguntarle lo siguiente:  Cmo le gustara que fuera el [place].  Cmo se siente.  Si siente los movimientos del beb.  Si ha tenido sntomas anormales, como prdida de lquido, [place], dolores de cabeza intensos o clicos abdominales.  Si tiene [employer]. Otros exmenes o estudios de deteccin que pueden realizarse  durante el tercer trimestre incluyen lo siguiente:  Anlisis de sangre para controlar las concentraciones de hierro (anemia).  Controles fetales para determinar su salud, nivel de [place] y Mining engineer. Si tiene [place] enfermedad o hay problemas durante el embarazo, le harn estudios. FALSO TRABAJO DE PARTO Es posible que sienta contracciones leves e irregulares que finalmente desaparecen. Se llaman contracciones de  Braxton Hicks o falso trabajo de Star. Las Yahoo pueden durar horas, das o incluso semanas, antes de que el verdadero trabajo de parto se inicie. Si las contracciones ocurren a intervalos regulares, se intensifican o se hacen dolorosas, lo mejor es que la revise el mdico.  SIGNOS DE TRABAJO DE PARTO   Clicos de tipo menstrual.  Contracciones cada 13minutos o menos.  Contracciones que comienzan en la parte superior del tero y se extienden hacia abajo, a la zona inferior del abdomen y la espalda.  Sensacin de mayor presin en la pelvis o dolor de espalda.  Una secrecin de mucosidad acuosa o con sangre que sale de la vagina. Si tiene alguno de estos signos antes de la [id number] del Media planner, llame a su mdico de inmediato. Debe concurrir al hospital para que la controlen inmediatamente. INSTRUCCIONES PARA EL CUIDADO EN EL HOGAR   Evite fumar, consumir hierbas, beber alcohol y tomar frmacos que no le hayan recetado. Estas sustancias qumicas afectan la formacin y el desarrollo del beb.  [place] mdico en relacin con el uso de medicamentos. Durante el embarazo, hay medicamentos que son seguros de tomar y otros que no.  Haga actividad fsica solo en la forma indicada por el mdico. Sentir clicos uterinos es un buen signo para Ambulance person actividad fsica.  Contine comiendo alimentos que sanos con regularidad.  Use un sostn que le brinde buen soporte si le [employer].  No se d baos de inmersin en agua caliente, baos turcos ni saunas.  Colquese el cinturn de seguridad cuando conduzca.  No coma carne cruda ni queso sin cocinar; evite el contacto con las bandejas sanitarias de los gatos y la tierra que estos animales usan. Estos elementos contienen grmenes que pueden causar defectos congnitos en el beb.  [place].  Si est estreida, pruebe un laxante suave (si el mdico lo autoriza). Consuma ms alimentos ricos en  fibra, como vegetales y frutas frescos y Psychologist, prison and probation services. Beba gran cantidad de lquido para mantener la orina de tono claro o color amarillo plido.  Dese baos de asiento con agua tibia para Best boy o las molestias causadas por las hemorroides. Use una crema para las hemorroides si el mdico la autoriza.  Si tiene venas varicosas, use medias de descanso. Eleve los pies durante 80minutos, 3 o 4veces por da. Limite la cantidad de sal en su dieta.  Evite levantar objetos pesados, use zapatos de tacones bajos y [place].  Descanse con las piernas elevadas si tiene calambres o dolor de cintura.  Visite a su dentista si no lo ha Quarry manager. Use un cepillo de dientes blando para higienizarse los dientes y psese el hilo dental con suavidad.  Puede seguir [employer], a menos que el mdico le indique lo contrario.  No haga viajes largos excepto que sea absolutamente necesario y solo con la autorizacin del [place] clases prenatales para Development worker, international aid, Psychologist, prison and probation services y hacer preguntas sobre el [place] de parto y [place].  Haga un ensayo de la partida al hospital.  Prepare el bolso que  llevar al hospital.  Prepare la habitacin del beb.  Concurra a todas las visitas prenatales segn las indicaciones de su mdico. SOLICITE ATENCIN MDICA SI:  No est segura de que est en trabajo de parto o de que ha roto la bolsa de las aguas.  Tiene mareos.  Siente clicos leves, presin en la pelvis o dolor persistente en el abdomen.  Tiene nuseas, vmitos o diarrea persistentes.  Tiene secrecin vaginal con mal olor.  Siente dolor al [employer]. SOLICITE ATENCIN MDICA DE INMEDIATO SI:   Tiene fiebre.  Tiene una prdida de lquido por la vagina.  Tiene sangrado o pequeas prdidas vaginales.  Siente dolor intenso o clicos en el abdomen.  Sube o baja de peso rpidamente.  Tiene dificultad para respirar y siente dolor de  pecho.  Sbitamente se le hinchan mucho el rostro, las [place], los tobillos, los pies o las piernas.  No ha sentido los movimientos del beb durante Georgianne Fick.  Siente un dolor de cabeza intenso que no se alivia con medicamentos.  Hay cambios en la visin. Document Released: 01/10/2005 Document Revised: 04/07/2013 [facility] Patient Information 2015 [place], [place]. This information is not intended to replace advice given to you by your health care provider. Make sure you discuss any questions you have with your health care provider. Third Trimester of Pregnancy The third trimester is from week 29 through week 42, months 7 through 9. The third trimester is a time when the fetus is growing rapidly. At the end of the ninth month, the fetus is about 20 inches in length and weighs 6-10 pounds.  BODY CHANGES Your body goes through many changes during pregnancy. The changes vary from woman to woman.   Your weight will continue to increase. You can expect to gain 25-35 pounds (11-16 kg) by the end of the pregnancy.  You may begin to get stretch marks on your hips, abdomen, and breasts.  You may urinate more often because the fetus is moving lower into your pelvis and pressing on your bladder.  You may develop or continue to have heartburn as a result of your pregnancy.  You may develop constipation because certain hormones are causing the muscles that push waste through your intestines to slow down.  You may develop hemorrhoids or swollen, bulging veins (varicose veins).  You may have pelvic pain because of the weight gain and pregnancy hormones relaxing your joints between the bones in your pelvis. Backaches may result from overexertion of the muscles supporting your posture.  You may have changes in your hair. These can include thickening of your hair, rapid growth, and changes in texture. Some women also have hair loss during or after pregnancy, or hair that feels dry or thin. Your hair will  most likely return to normal after your baby is born.  Your breasts will continue to grow and be tender. A yellow discharge may leak from your breasts called colostrum.  Your belly button may stick out.  You may feel short of breath because of your expanding uterus.  You may notice the fetus "dropping," or moving lower in your abdomen.  You may have a bloody mucus discharge. This usually occurs a few days to a week before labor begins.  Your cervix becomes thin and soft (effaced) near your due date. WHAT TO EXPECT AT YOUR PRENATAL EXAMS  You will have prenatal exams every 2 weeks until week 36. Then, you will have weekly prenatal exams. During a routine prenatal visit:  You will be  weighed to make sure you and the fetus are growing normally.  Your blood pressure is taken.  Your abdomen will be measured to track your baby's growth.  The fetal heartbeat will be listened to.  Any test results from the previous visit will be discussed.  You may have a cervical check near your due date to see if you have effaced. At around 36 weeks, your caregiver will check your cervix. At the same time, your caregiver will also perform a test on the secretions of the vaginal tissue. This test is to determine if a type of bacteria, Group B streptococcus, is present. Your caregiver will explain this further. Your caregiver may ask you:  What your birth plan is.  How you are feeling.  If you are feeling the baby move.  If you have had any abnormal symptoms, such as leaking fluid, bleeding, severe headaches, or abdominal cramping.  If you have any questions. Other tests or screenings that may be performed during your third trimester include:  Blood tests that check for low iron levels (anemia).  Fetal testing to check the health, activity level, and growth of the fetus. Testing is done if you have certain medical conditions or if there are problems during the pregnancy. FALSE LABOR You may feel  small, irregular contractions that eventually go away. These are called Braxton Hicks contractions, or false labor. Contractions may last for hours, days, or even weeks before true labor sets in. If contractions come at regular intervals, intensify, or become painful, it is best to be seen by your caregiver.  SIGNS OF LABOR   Menstrual-like cramps.  Contractions that are 5 minutes apart or less.  Contractions that start on the top of the uterus and spread down to the lower abdomen and back.  A sense of increased pelvic pressure or back pain.  A watery or bloody mucus discharge that comes from the vagina. If you have any of these signs before the 37th week of pregnancy, call your caregiver right away. You need to go to the hospital to get checked immediately. HOME CARE INSTRUCTIONS   Avoid all smoking, herbs, alcohol, and unprescribed drugs. These chemicals affect the formation and growth of the baby.  Follow your caregiver's instructions regarding medicine use. There are medicines that are either safe or unsafe to take during pregnancy.  Exercise only as directed by your caregiver. Experiencing uterine cramps is a good sign to stop exercising.  Continue to eat regular, healthy meals.  Wear a good support bra for breast tenderness.  Do not use hot tubs, steam rooms, or saunas.  Wear your seat belt at all times when driving.  Avoid raw meat, uncooked cheese, cat litter boxes, and soil used by cats. These carry germs that can cause birth defects in the baby.  Take your prenatal vitamins.  Try taking a stool softener (if your caregiver approves) if you develop constipation. Eat more high-fiber foods, such as fresh vegetables or fruit and whole grains. Drink plenty of fluids to keep your urine clear or pale yellow.  Take warm sitz baths to soothe any pain or discomfort caused by hemorrhoids. Use hemorrhoid cream if your caregiver approves.  If you develop varicose veins, wear support  hose. Elevate your feet for 15 minutes, 3-4 times a day. Limit salt in your diet.  Avoid heavy lifting, wear low heal shoes, and practice good posture.  Rest a lot with your legs elevated if you have leg cramps or low back pain.  Visit your dentist if you have not gone during your pregnancy. Use a soft toothbrush to brush your teeth and be gentle when you floss.  A sexual relationship may be continued unless your caregiver directs you otherwise.  Do not travel far distances unless it is absolutely necessary and only with the approval of your caregiver.  Take prenatal classes to understand, practice, and ask questions about the labor and delivery.  Make a trial run to the hospital.  Pack your hospital bag.  Prepare the baby's nursery.  Continue to go to all your prenatal visits as directed by your caregiver. SEEK MEDICAL CARE IF:  You are unsure if you are in labor or if your water has broken.  You have dizziness.  You have mild pelvic cramps, pelvic pressure, or nagging pain in your abdominal area.  You have persistent nausea, vomiting, or diarrhea.  You have a bad smelling vaginal discharge.  You have pain with urination. SEEK IMMEDIATE MEDICAL CARE IF:   You have a fever.  You are leaking fluid from your vagina.  You have spotting or bleeding from your vagina.  You have severe abdominal cramping or pain.  You have rapid weight loss or gain.  You have shortness of breath with chest pain.  You notice sudden or extreme swelling of your face, hands, ankles, feet, or legs.  You have not felt your baby move in over an hour.  You have severe headaches that do not go away with medicine.  You have vision changes. Document Released: 03/27/2001 Document Revised: 04/07/2013 Document Reviewed: 06/03/2012 [facility] Patient Information 2015 [place], [place]. This information is not intended to replace advice given to you by your health care provider. Make sure you discuss any  questions you have with your health care provider.

## 2014-08-19 NOTE — MAU Note (Signed)
Pt reports contractions, also reports ? Leaking fluid since 10 am

## 2014-08-19 NOTE — H&P (Signed)
Lori Sanford is a 24 y.o. female, [id number] at 1339 2/7 weeks, presenting for brown d/c since leaving MAU earlier today s/p labor check.  Denies active bleeding, reports +FM.  Patient Active Problem List   Diagnosis Date Noted  . Normal labor 08/19/2014  . Positive GBS test 08/19/2014  . LGA (large for gestational age) fetus--95%ile at 32 weeks. 08/19/2014  . Headache, infrequent episodic tension-type 04/21/2014    History of present pregnancy: Patient entered care at 11 1/7 weeks.   EDC of 08/24/14 was established by US at 14 3/7 weeks   Anatomy scan: 27 1.7 weeks, with limited anatomy, EFW 2+10, 78%ile, and an posterior placenta, normal fluid, cervix closed.   Additional US evaluations:   32 weeks:  EFW 4+3, 95%ile, AFI 15.2,  55%ile, cervix 3.99, vtx, with completion of normal anatomy. Significant prenatal events:  Patient seen multiple times in MAU and at the office for various issues, but no significant risk issues noted.  BTL papers signed 07/26/14 Last evaluation:  08/18/14--Cervix 3 cm, 50%, vtx, membranes swept, BP 98/64.  OB History    Gravida Para Term Preterm AB TAB SAB Ectopic Multiple Living   4 2 2  1  1   2     2007--[facility], 41 weeks, 24 hour labor, 7 lbs, female, epidural, [facility] 2008--SVB, 38 weeks, 6 hour labor, 7 labs, no meds, [facility] 2014--SAB, 8 weeks.  Past Medical History  Diagnosis Date  . Medical history non-contributory    Past Surgical History  Procedure Laterality Date  . No past surgeries     Family History: family history includes Diabetes in her father; Hypertension in her mother; Stroke in her mother.   Social History:  reports that she has never smoked. She has never used smokeless tobacco. She reports that she does not drink alcohol or use illicit drugs.  Patient is Hispanic, married to FOB (Lori Sanford), who is involved and supportive.  She has 11th grade education, employed as Clinical biochemistCustomer Service Rep.   Prenatal Transfer Tool  Maternal Diabetes:  No Genetic Screening: Declined Maternal Ultrasounds/Referrals: Normal Fetal Ultrasounds or other Referrals:  None Maternal Substance Abuse:  No Significant Maternal Medications:  None Significant Maternal Lab Results: Lab values include: Group B Strep positive  TDAP 06/03/14 Flu 02/26/14  ROS:  Contractions,+FM, brown d/c.  No Known Allergies   Dilation: 5 Effacement (%): 70 Station: -2 Exam by:: Lathum Blood pressure 105/65, pulse 103, temperature 98.4 F (36.9 C), resp. rate 18, SpO2 100 %.  Chest clear Heart RRR without murmur Abd gravid, NT, FH 40 Pelvic: As above, BBOW Ext: WNL  FHR: Category 2--moderate variability, late decels with some UCs, accels present before and after occasional decels UCs:  q 5 min, moderate  Prenatal labs: ABO, Rh: --/--/O POS (05/05 0845) Antibody: POS (05/05 0845) Rubella:   Immune RPR: Non Reactive (05/05 0845)  HBsAg: Negative (10/08 0000)  HIV: Non-reactive (10/08 0000)  GBS:  Positive 07/26/14 Sickle cell/Hgb electrophoresis:  AA Pap:  02/10/14 GC:  Negative 01/21/14 Chlamydia:  Negative 01/21/14 Genetic screenings:  Declined Glucola:  WNL Other:   Hgb 11.5 at NOB, 11.2 at 28 weeks    Assessment/Plan: IUP at 38 2/7 weeks Early labor GBS positive LGA fetus Category 2 FHR, moderate variability Desires BTL, but consent signed 07/26/14 (24 days ago)  [facility] with Dr. Stefano GaulStringer Routine [facility] orders Pain med/epidural prn PCN G for GBS prophylaxis  IV hydration  Lori Sanford, [place]ICKICNM, [place] 08/19/2014, 7:54 PM

## 2014-08-19 NOTE — MAU Note (Signed)
Pt sent home ealier today. Took a nap noticed some brown discharge and some blood clots after she got up. Had not felt baby move since she woke up. Still having ctx like before. Not any stronger.

## 2014-08-19 NOTE — MAU Provider Note (Signed)
Addendum VE unchanged FHT 135 + accel, moderate variability, no decel, occasional ctx Recheck in a few hours  Jaydan Chretien, CNM 0831    Addendum VE unchanged FHT 125, + accel, no decel, occasional ctx con't with PCN infusion then DC to home in stable condition with labor precaution Kick count, and bleeding precaution  Yaira Bernardi, CNM [id number]

## 2014-08-20 ENCOUNTER — Inpatient Hospital Stay (HOSPITAL_COMMUNITY): Payer: Medicaid Other | Admitting: Anesthesiology

## 2014-08-20 ENCOUNTER — Encounter (HOSPITAL_COMMUNITY): Payer: Self-pay | Admitting: Anesthesiology

## 2014-08-20 LAB — CBC
HEMATOCRIT: 34.1 % — AB (ref 36.0–46.0)
Hemoglobin: 11.7 g/dL — ABNORMAL LOW (ref 12.0–15.0)
MCH: 30.3 pg (ref 26.0–34.0)
MCHC: 34.3 g/dL (ref 30.0–36.0)
MCV: 88.3 fL (ref 78.0–100.0)
PLATELETS: 193 10*3/uL (ref 150–400)
RBC: 3.86 MIL/uL — AB (ref 3.87–5.11)
RDW: 13.8 % (ref 11.5–15.5)
WBC: 12.3 10*3/uL — AB (ref 4.0–10.5)

## 2014-08-20 MED ORDER — SENNOSIDES-DOCUSATE SODIUM 8.6-50 MG PO TABS
2.0000 | ORAL_TABLET | ORAL | Status: DC
  Administered 2014-08-20: 2 via ORAL
  Filled 2014-08-20: qty 2

## 2014-08-20 MED ORDER — ONDANSETRON HCL 4 MG PO TABS: 4.0000 mg | ORAL_TABLET | ORAL | Status: DC | PRN

## 2014-08-20 MED ORDER — METHYLERGONOVINE MALEATE 0.2 MG/ML IJ SOLN: 0.2000 mg | Freq: Four times a day (QID) | INTRAMUSCULAR | Status: DC

## 2014-08-20 MED ORDER — METHYLERGONOVINE MALEATE 0.2 MG PO TABS
0.2000 mg | ORAL_TABLET | Freq: Four times a day (QID) | ORAL | Status: DC
  Administered 2014-08-20 – 2014-08-21 (×5): 0.2 mg via ORAL
  Filled 2014-08-20 (×5): qty 1

## 2014-08-20 MED ORDER — FENTANYL 2.5 MCG/ML BUPIVACAINE 1/10 % EPIDURAL INFUSION (WH - ANES)
14.0000 mL/h | INTRAMUSCULAR | Status: DC | PRN
  Administered 2014-08-20: 14 mL/h via EPIDURAL
  Filled 2014-08-20: qty 125

## 2014-08-20 MED ORDER — OXYCODONE-ACETAMINOPHEN 5-325 MG PO TABS: 2.0000 | ORAL_TABLET | ORAL | Status: DC | PRN

## 2014-08-20 MED ORDER — DIBUCAINE 1 % RE OINT: 1.0000 "application " | TOPICAL_OINTMENT | RECTAL | Status: DC | PRN

## 2014-08-20 MED ORDER — LANOLIN HYDROUS EX OINT: TOPICAL_OINTMENT | CUTANEOUS | Status: DC | PRN

## 2014-08-20 MED ORDER — OXYCODONE-ACETAMINOPHEN 5-325 MG PO TABS
1.0000 | ORAL_TABLET | ORAL | Status: DC | PRN
  Administered 2014-08-20: 1 via ORAL
  Filled 2014-08-20: qty 1

## 2014-08-20 MED ORDER — SIMETHICONE 80 MG PO CHEW: 80.0000 mg | CHEWABLE_TABLET | ORAL | Status: DC | PRN

## 2014-08-20 MED ORDER — PRENATAL MULTIVITAMIN CH
1.0000 | ORAL_TABLET | Freq: Every day | ORAL | Status: DC
  Administered 2014-08-20 – 2014-08-21 (×2): 1 via ORAL
  Filled 2014-08-20 (×2): qty 1

## 2014-08-20 MED ORDER — DIPHENHYDRAMINE HCL 25 MG PO CAPS: 25.0000 mg | ORAL_CAPSULE | Freq: Four times a day (QID) | ORAL | Status: DC | PRN

## 2014-08-20 MED ORDER — EPHEDRINE 5 MG/ML INJ
10.0000 mg | INTRAVENOUS | Status: DC | PRN
  Filled 2014-08-20: qty 2

## 2014-08-20 MED ORDER — IBUPROFEN 600 MG PO TABS
600.0000 mg | ORAL_TABLET | Freq: Four times a day (QID) | ORAL | Status: DC
  Administered 2014-08-20 – 2014-08-21 (×6): 600 mg via ORAL
  Filled 2014-08-20 (×6): qty 1

## 2014-08-20 MED ORDER — TETANUS-DIPHTH-ACELL PERTUSSIS 5-2.5-18.5 LF-MCG/0.5 IM SUSP: 0.5000 mL | Freq: Once | INTRAMUSCULAR | Status: DC

## 2014-08-20 MED ORDER — WITCH HAZEL-GLYCERIN EX PADS: 1.0000 "application " | MEDICATED_PAD | CUTANEOUS | Status: DC | PRN

## 2014-08-20 MED ORDER — PHENYLEPHRINE 40 MCG/ML (10ML) SYRINGE FOR IV PUSH (FOR BLOOD PRESSURE SUPPORT)
80.0000 ug | PREFILLED_SYRINGE | INTRAVENOUS | Status: DC | PRN
  Filled 2014-08-20: qty 2
  Filled 2014-08-20: qty 20

## 2014-08-20 MED ORDER — ONDANSETRON HCL 4 MG/2ML IJ SOLN: 4.0000 mg | INTRAMUSCULAR | Status: DC | PRN

## 2014-08-20 MED ORDER — ACETAMINOPHEN 325 MG PO TABS: 650.0000 mg | ORAL_TABLET | ORAL | Status: DC | PRN

## 2014-08-20 MED ORDER — BENZOCAINE-MENTHOL 20-0.5 % EX AERO: 1.0000 "application " | INHALATION_SPRAY | CUTANEOUS | Status: DC | PRN

## 2014-08-20 MED ORDER — LIDOCAINE HCL (PF) 1 % IJ SOLN
INTRAMUSCULAR | Status: DC | PRN
  Administered 2014-08-20 (×2): 8 mL

## 2014-08-20 MED ORDER — FERROUS SULFATE 325 (65 FE) MG PO TABS
325.0000 mg | ORAL_TABLET | Freq: Two times a day (BID) | ORAL | Status: DC
  Administered 2014-08-20 – 2014-08-21 (×3): 325 mg via ORAL
  Filled 2014-08-20 (×3): qty 1

## 2014-08-20 MED ORDER — ZOLPIDEM TARTRATE 5 MG PO TABS: 5.0000 mg | ORAL_TABLET | Freq: Every evening | ORAL | Status: DC | PRN

## 2014-08-20 MED ORDER — DIPHENHYDRAMINE HCL 50 MG/ML IJ SOLN: 12.5000 mg | INTRAMUSCULAR | Status: DC | PRN

## 2014-08-20 NOTE — Progress Notes (Signed)
  Subjective: Reports needing to push - screaming. Requesting epidural. Family at bedside.  Objective: BP 127/76 mmHg  Pulse 98  Temp(Src) 98 F (36.7 C) (Oral)  Resp 18  Ht 5\' 4"  (1.626 m)  Wt 93.441 kg (206 lb)  BMI 35.34 kg/m2  SpO2 100%     Today's Vitals   08/19/14 2057 08/19/14 2300 08/19/14 2312 08/20/14 0026  BP:  127/76    Pulse:  98    Temp:  98 F (36.7 C)    TempSrc:  Oral    Resp:  18    Height: 5\' 4"  (1.626 m)     Weight: 93.441 kg (206 lb)     SpO2:      PainSc: 5  6  7  8     FHT: BL 130 w/ moderate variability, +accels, no decels UC:   irregular, every 2-5 minutes SVE:   Dilation: 6 Effacement (%): 70 Station: -1 Exam by:: Thornell MuleK. Toshiro Hanken, CNM Cervix feels thinner  Assessment:  Minimal cervical change Cat 1 tracing  Plan: Receiving bolus for epidural Augment as needed  Sherre ScarletWILLIAMS, Jon Kasparek CNM 08/20/2014, 12:52 AM

## 2014-08-20 NOTE — Lactation Note (Addendum)
This note was copied from the chart of Boy Tehilla Hodzic. Lactation Consultation Note  Patient Name: Boy Fransico MichaelRoxana Furgeson ZOXWR'UToday's Date: 08/20/2014 Reason for consult: Initial assessment Baby 10 hours of life. Mom reports BF going very well. Mom states that she nursed one of her children for over 3 years without any issues. Baby has nursed 4 times in 10 hours. Enc mom to ask for assistance as needed. [facility], aware of [facility]FSG, community resources, and [facility]  Maternal Data Does the patient have breastfeeding experience prior to this delivery?: Yes  Feeding Feeding Type: Breast Fed  LATCH Score/Interventions Latch: Grasps breast easily, tongue down, lips flanged, rhythmical sucking.  Audible Swallowing: A few with stimulation  Type of Nipple: Everted at rest and after stimulation  Comfort (Breast/Nipple): Soft / non-tender     Hold (Positioning): Assistance needed to correctly position infant at breast and maintain latch. Intervention(s): Breastfeeding basics reviewed;Support Pillows;Position options;Skin to skin  LATCH Score: 8  Lactation Tools Discussed/Used     Consult Status Consult Status: PRN    Geralynn OchsWILLIARD, Letta Cargile 08/20/2014, 1:49 PM

## 2014-08-20 NOTE — Anesthesia Postprocedure Evaluation (Signed)
Anesthesia Post Note  Patient: Lori Sanford  Procedure(s) Performed: * No procedures listed *  Anesthesia type: Epidural  Patient location: Mother/Baby  Post pain: Pain level controlled  Post assessment: Post-op Vital signs reviewed  Last Vitals:  Filed Vitals:   08/20/14 0651  BP: 114/62  Pulse: 87  Temp: 37.6 C  Resp: 18    Post vital signs: Reviewed  Level of consciousness:alert  Complications: No apparent anesthesia complications

## 2014-08-20 NOTE — Anesthesia Preprocedure Evaluation (Signed)

## 2014-08-20 NOTE — Progress Notes (Signed)
  Subjective: Assuming care of this 24 yo [id number] @ 39.3 wks admitted in active labor. Called out w/ urge to push. Support personnel at bedside.  Objective: BP 127/76 mmHg  Pulse 98  Temp(Src) 98 F (36.7 C) (Oral)  Resp 18  Ht 5\' 4"  (1.626 m)  Wt 93.441 kg (206 lb)  BMI 35.34 kg/m2  SpO2 100%     Today's Vitals   08/19/14 2048 08/19/14 2057 08/19/14 2300 08/19/14 2312  BP: 114/72  127/76   Pulse: 91  98   Temp: 97.5 F (36.4 C)  98 F (36.7 C)   TempSrc: Oral  Oral   Resp: 18  18   Height:  5\' 4"  (1.626 m)    Weight:  93.441 kg (206 lb)    SpO2:      PainSc:  5  6  7      FHT: BL 135 w/ moderate variability, no accels, occ variable UC:   irregular, every 2-5 minutes SVE:   Dilation: 6 Effacement (%): 70 Station: -1 Exam by:: C.byers, RN Received Stadol at 23:15 PM  Assessment:  IUP at term GBS positive No cervical change  Plan: Continue current plan Expect progress and SVD  Sherre ScarletWILLIAMS, Tamatha Gadbois CNM 08/20/2014, 12:22 AM

## 2014-08-20 NOTE — Progress Notes (Signed)
Shortly after turning pt, reported strong (uncontrollable) urge to push. Cvx noted to be C/C/+2. P: Prepare for imminent delivery.   Sherre ScarletKimberly Aundre Hietala, CNM 08/20/14,  02:50 AM

## 2014-08-20 NOTE — Anesthesia Procedure Notes (Signed)
Epidural Patient location during procedure: OB Start time: 08/20/2014 1:18 AM End time: 08/20/2014 1:22 AM  Staffing Anesthesiologist: Leilani AbleHATCHETT, Dionna Wiedemann Performed by: anesthesiologist   Preanesthetic Checklist Completed: patient identified, surgical consent, pre-op evaluation, timeout performed, IV checked, risks and benefits discussed and monitors and equipment checked  Epidural Patient position: sitting Prep: site prepped and draped and DuraPrep Patient monitoring: continuous pulse ox and blood pressure Approach: midline Location: L3-L4 Injection technique: LOR air  Needle:  Needle type: Tuohy  Needle gauge: 17 G Needle length: 9 cm and 9 Needle insertion depth: 5 cm cm Catheter type: closed end flexible Catheter size: 19 Gauge Catheter at skin depth: 10 cm Test dose: negative and Other  Assessment Sensory level: T9 Events: blood not aspirated, injection not painful, no injection resistance, negative IV test and no paresthesia  Additional Notes Reason for block:procedure for pain

## 2014-08-20 NOTE — Progress Notes (Signed)
  Subjective: Sleeping, yet easily aroused. Comfortable w/ epidural.  Objective: BP 107/63 mmHg  Pulse 96  Temp(Src) 98.4 F (36.9 C) (Oral)  Resp 18  Ht 5\' 4"  (1.626 m)  Wt 93.441 kg (206 lb)  BMI 35.34 kg/m2  SpO2 99%     Today's Vitals   08/20/14 0150 08/20/14 0154 08/20/14 0155 08/20/14 0200  BP: 116/67  111/69 107/63  Pulse: 79 89 93 96  Temp:      TempSrc:      Resp: 18  18 18   Height:      Weight:      SpO2: 98%  99%   PainSc:    0-No pain   FHT: BL 140 w/ moderate variability, +accels, isolated late decel w/ spontaneous recovery (pt noted to be lying flat on back) UC:   irregular, every 2-3 minutes SVE:   Dilation: 8 Effacement (%): 80 Station: 0 Exam by:: Thornell MuleK. Shian Goodnow, CNM Cvx thicker on left  Assessment:  Active labor Cat 2 resolved to Cat 1 with position change (on side of thickest part of cvx)  Plan: Continue expectant management Anticipate SVD  Sherre ScarletWILLIAMS, Aradia Estey CNM 08/20/2014, 2:36 AM

## 2014-08-20 NOTE — Progress Notes (Signed)
Subjective: Postpartum Day 0: Vaginal delivery, 2nd degree vaginal laceration Patient up ad lib, reports no syncope or dizziness. Feeding:  Breast Contraceptive plan:  Considering Nexplanon  Plans outpatient circumcision at peds office.  Objective: Vital signs in last 24 hours: Temp:  [97.5 F (36.4 C)-99.6 F (37.6 C)] 98.3 F (36.8 C) (05/06 0945) Pulse Rate:  [76-122] 90 (05/06 0945) Resp:  [18] 18 (05/06 0945) BP: (104-127)/(55-88) 111/58 mmHg (05/06 0945) SpO2:  [97 %-100 %] 98 % (05/06 0945) Weight:  [93.441 kg (206 lb)] 93.441 kg (206 lb) (05/05 2057)  Physical Exam:  General: alert Lochia: appropriate Uterine Fundus: firm Perineum: healing well DVT Evaluation: No evidence of DVT seen on physical exam. Negative Homan's sign.    Recent Labs  08/19/14 0845 08/20/14 0640  HGB 12.1 11.7*  HCT 34.9* 34.1*    Assessment/Plan: Status post vaginal delivery day 0 GBS positive. Stable Continue current care.   Moriah Shawley, VICKICNM 08/20/2014, 11:42 AM

## 2014-08-21 MED ORDER — FERROUS SULFATE 325 (65 FE) MG PO TABS: 325.0000 mg | ORAL_TABLET | Freq: Two times a day (BID) | ORAL | Status: AC

## 2014-08-21 MED ORDER — IBUPROFEN 600 MG PO TABS: 600.0000 mg | ORAL_TABLET | Freq: Four times a day (QID) | ORAL | Status: AC

## 2014-08-21 MED ORDER — OXYCODONE-ACETAMINOPHEN 5-325 MG PO TABS: 1.0000 | ORAL_TABLET | ORAL | Status: AC | PRN

## 2014-08-21 NOTE — Lactation Note (Signed)
This note was copied from the chart of Boy Josetta Rockholt. Lactation Consultation Note  Patient Name: Boy Fransico MichaelRoxana Manard ZOXWR'UToday's Date: 08/21/2014 Reason for consult: Follow-up assessment Baby 34 hours of life. Mom states baby nursing well and she has no concerns of questions at this time. Mom aware of OP/BFSG and LC phone line assistance after D/C.   Maternal Data    Feeding Feeding Type: Breast Fed Length of feed: 15 min  LATCH Score/Interventions                      Lactation Tools Discussed/Used     Consult Status Consult Status: Complete    Donta Mcinroy 08/21/2014, 1:43 PM

## 2014-08-21 NOTE — Discharge Summary (Signed)
Vaginal Delivery Discharge Summary  ALL information will be verified prior to discharge  Lori Sanford  DOB:    [birth year] MRN:    [id number] CSN:    [id number]  Date of admission:                  08/19/14  Date of discharge:                   08/21/14  Procedures this admission: DVS  Date of Delivery: 08/19/14  Newborn Data:  Live born  Information for the patient's newborn:  Salomon FickHolguin, Boy Karaline [[id number]][[id number]]  female   Live born female  Birth Weight: 8 lb 4.1 oz (3745 g) APGAR: 9, 9  Home with mother. Name: [name] Circumcision Plan: out patient   History of Present Illness: Lori Sanford is a 24 y.o. female, [phone], who presents at 1722w3d weeks gestation. [facility]. She was admitted onset of labor. Her pregnancy has been complicated by:  Patient Active Problem List   Diagnosis Date Noted  . Vaginal delivery 08/20/2014  . Second-degree perineal laceration, with delivery 08/20/2014  . Positive GBS test 08/19/2014  . Headache, infrequent episodic tension-type 04/21/2014    Hospital course: The patient was admitted for labor.   Her labor was not complicated. She proceeded to have a vaginal delivery of a healthy infant. Her delivery was not complicated. Her postpartum course was not complicated. She was discharged to home on postpartum day 2 doing well.  Feeding: breast  Contraception: Nexplanon Pt understands the risks birth control are not limited to irregular bleeding, formation of DVT, fluid fluctuations, elevation in blood pressure, stroke, breast tenderness and liver damage.  She states she will report any serious side effects.  She has been given verbal and written instructions and voiced a clear understanding.    Discharge hemoglobin: HEMOGLOBIN  Date Value Ref Range Status  08/20/2014 11.7* 12.0 - 15.0 g/dL Final   HCT  Date Value Ref Range Status    08/20/2014 34.1* 36.0 - 46.0 % Final    PreNatal Labs ABO, Rh: --/--/O POS (05/05 0845)   Antibody: POS (05/05 0845) Rubella:    immune RPR: Non Reactive (05/05 0845)  HBsAg: Negative (10/08 0000)  HIV: Non-reactive (10/08 0000)  GBS: Positive (04/11 0000)  Discharge Physical Exam:  General: alert and cooperative Lochia: appropriate Uterine Fundus: firm Incision: healing well DVT Evaluation: No evidence of DVT seen on physical exam.  Intrapartum Procedures: spontaneous vaginal delivery and GBS prophylaxis Postpartum Procedures: none Complications-Operative and Postpartum: 2 degree perineal laceration  Discharge Diagnoses: Term Pregnancy-delivered,  asymptomatic anemia  Activity:           pelvic rest Diet:                routine Medications: PNV, Ibuprofen, Iron and Percocet Condition:      stable     Postpartum Teaching: Nutrition, exercise, return to work or school, family visits, sexual activity, home rest, vaginal bleeding, pelvic rest, family planning, s/s of PPD, breast care peri-care and incision care   Discharge to: home  Follow-up Information    [facility]. Schedule an appointment as soon as possible for a visit in 5 weeks.   Specialty:  Obstetrics and Gynecology   Why:  Postpartum check up   Contact information:   [address]  [place] [address] [phone]      Follow up with [facility]. Schedule an appointment as soon as possible for a visit in 6 weeks.   Specialty:  Obstetrics and Gynecology   Why:  nexplanon placement   Contact information:   [address] [place] [address] [phone]       Masey Scheiber, CNM, MSN 08/21/2014. 9:40 AM  All information will be verified prior to discharge   Postpartum Care After Vaginal Delivery  After you deliver your newborn (postpartum period), the usual  stay in the hospital is 24 72 hours. If there were problems with your labor or delivery, or if you have other medical problems, you might be in the hospital longer.  While you are in the hospital, you will receive help and instructions on how to care for yourself and your newborn during the postpartum period.  While you are in the hospital:  Be sure to tell your nurses if you have pain or discomfort, as well as where you feel the pain and what makes the pain worse.  If you had an incision made near your vagina (episiotomy) or if you had some tearing during delivery, the nurses may put ice packs on your episiotomy or tear. The ice packs may help to reduce the pain and swelling.  If you are breastfeeding, you may feel uncomfortable contractions of your uterus for a couple of weeks. This is normal. The contractions help your uterus get back to normal size.  It is normal to have some bleeding after delivery.  For the first 1 3 days after delivery, the flow is red and the amount may be similar to a period.  It is common for the flow to start and stop.  In the first few days, you may pass some small clots. Let your nurses know if you begin to pass large clots or your flow increases.  Do not  flush blood clots down the toilet before having the nurse look at them.  During the next 3 10 days after delivery, your flow should become more watery and pink or brown-tinged in color.  Ten to fourteen days after delivery, your flow should be a small amount of yellowish-white discharge.  The amount of your flow will decrease over the first few weeks after delivery. Your flow may stop in 6 8 weeks. Most women have had their flow stop by 12 weeks after delivery.  You should change your sanitary pads frequently.  Wash your hands thoroughly with soap and water for at least 20 seconds after changing pads, using the toilet, or before holding or feeding your newborn.  You should feel like you need to empty your  bladder within the first 6 8 hours after delivery.  In case you become weak, lightheaded, or faint, call your nurse before you get out of bed for the first time and before you take a shower for the first time.  Within the first few days after delivery, your breasts may begin to feel tender and full. This is called engorgement. Breast tenderness usually goes away within 48 72 hours after engorgement occurs. You may also notice milk leaking from your breasts. If you are not breastfeeding, do not stimulate your breasts. Breast stimulation can make your breasts produce more milk.  Spending as much time as possible with your newborn is very important. During this time, you and your newborn can feel close and get to know each other. Having your  newborn stay in your room (rooming in) will help to strengthen the bond with your newborn. It will give you time to get to know your newborn and become comfortable caring for your newborn.  Your hormones change after delivery. Sometimes the hormone changes can temporarily cause you to feel sad or tearful. These feelings should not last more than a few days. If these feelings last longer than that, you should talk to your caregiver.  If desired, talk to your caregiver about methods of family planning or contraception.  Talk to your caregiver about immunizations. Your caregiver may want you to have the following immunizations before leaving the hospital:  Tetanus, diphtheria, and pertussis (Tdap) or tetanus and diphtheria (Td) immunization. It is very important that you and your family (including grandparents) or others caring for your newborn are up-to-date with the Tdap or Td immunizations. The Tdap or Td immunization can help protect your newborn from getting ill.  Rubella immunization.  Varicella (chickenpox) immunization.  Influenza immunization. You should receive this annual immunization if you did not receive the immunization during your  pregnancy. Document Released: 01/28/2007 Document Revised: 12/26/2011 Document Reviewed: 11/28/2011 [facility] Patient Information 2014 [place], [place].   Postpartum Depression and Baby Blues  The postpartum period begins right after the birth of a baby. During this time, there is often a great amount of joy and excitement. It is also a time of considerable changes in the life of the parent(s). Regardless of how many times a mother gives birth, each child brings new challenges and dynamics to the family. It is not unusual to have feelings of excitement accompanied by confusing shifts in moods, emotions, and thoughts. All mothers are at risk of developing postpartum depression or the "baby blues." These mood changes can occur right after giving birth, or they may occur many months after giving birth. The baby blues or postpartum depression can be mild or severe. Additionally, postpartum depression can resolve rather quickly, or it can be a long-term condition. CAUSES Elevated hormones and their rapid decline are thought to be a main cause of postpartum depression and the baby blues. There are a number of hormones that radically change during and after pregnancy. Estrogen and progesterone usually decrease immediately after delivering your baby. The level of thyroid hormone and various cortisol steroids also rapidly drop. Other factors that play a major role in these changes include major life events and genetics.  RISK FACTORS If you have any of the following risks for the baby blues or postpartum depression, know what symptoms to watch out for during the postpartum period. Risk factors that may increase the likelihood of getting the baby blues or postpartum depression include: 1. Havinga personal or family history of depression. 2. Having depression while being pregnant. 3. Having premenstrual or oral contraceptive-associated mood issues. 4. Having exceptional life stress. 5. Having marital  conflict. 6. Lacking a social support network. 7. Having a baby with special needs. 8. Having health problems such as diabetes. SYMPTOMS Baby blues symptoms include:  Brief fluctuations in mood, such as going from extreme happiness to sadness.  Decreased concentration.  Difficulty sleeping.  Crying spells, tearfulness.  Irritability.  Anxiety. Postpartum depression symptoms typically begin within the first month after giving birth. These symptoms include:  Difficulty sleeping or excessive sleepiness.  Marked weight loss.  Agitation.  Feelings of worthlessness.  Lack of interest in activity or food. Postpartum psychosis is a very concerning condition and can be dangerous. Fortunately, it is rare. Displaying any of  the following symptoms is cause for immediate medical attention. Postpartum psychosis symptoms include:  Hallucinations and delusions.  Bizarre or disorganized behavior.  Confusion or disorientation. DIAGNOSIS  A diagnosis is made by an evaluation of your symptoms. There are no medical or lab tests that lead to a diagnosis, but there are various questionnaires that a caregiver may use to identify those with the baby blues, postpartum depression, or psychosis. Often times, a screening tool called the [place] Postnatal Depression Scale is used to diagnose depression in the postpartum period.  TREATMENT The baby blues usually goes away on its own in 1 to 2 weeks. Social support is often all that is needed. You should be encouraged to get adequate sleep and rest. Occasionally, you may be given medicines to help you sleep.  Postpartum depression requires treatment as it can last several months or longer if it is not treated. Treatment may include individual or group therapy, medicine, or both to address any social, physiological, and psychological factors that may play a role in the depression. Regular exercise, a healthy diet, rest, and social support may also be  strongly recommended.  Postpartum psychosis is more serious and needs treatment right away. Hospitalization is often needed. HOME CARE INSTRUCTIONS  Get as much rest as you can. Nap when the baby sleeps.  Exercise regularly. Some women find yoga and walking to be beneficial.  Eat a balanced and nourishing diet.  Do little things that you enjoy. Have a cup of tea, take a bubble bath, read your favorite magazine, or listen to your favorite music.  Avoid alcohol.  Ask for help with household chores, cooking, grocery shopping, or running errands as needed. Do not try to do everything.  Talk to people close to you about how you are feeling. Get support from your partner, family members, friends, or other new moms.  Try to stay positive in how you think. Think about the things you are grateful for.  Do not spend a lot of time alone.  Only take medicine as directed by your caregiver.  Keep all your postpartum appointments.  Let your caregiver know if you have any concerns. SEEK MEDICAL CARE IF: You are having a reaction or problems with your medicine. SEEK IMMEDIATE MEDICAL CARE IF:  You have suicidal feelings.  You feel you may harm the baby or someone else. Document Released: 01/05/2004 Document Revised: 06/25/2011 Document Reviewed: 02/06/2011 [facility] Patient Information 2014 [place].     Breastfeeding Deciding to breastfeed is one of the best choices you can make for you and your baby. A change in hormones during pregnancy causes your breast tissue to grow and increases the number and size of your milk ducts. These hormones also allow proteins, sugars, and fats from your blood supply to make breast milk in your milk-producing glands. Hormones prevent breast milk from being released before your baby is born as well as prompt milk flow after birth. Once breastfeeding has begun, thoughts of your baby, as well as his or her sucking or crying, can stimulate the release of  milk from your milk-producing glands.  BENEFITS OF BREASTFEEDING For Your Baby  Your first milk (colostrum) helps your baby's digestive system function better.   There are antibodies in your milk that help your baby fight off infections.   Your baby has a lower incidence of asthma, allergies, and sudden infant death syndrome.   The nutrients in breast milk are better for your baby than infant formulas and are designed uniquely  for your baby's needs.   Breast milk improves your baby's brain development.   Your baby is less likely to develop other conditions, such as childhood obesity, asthma, or type 2 diabetes mellitus.  For You   Breastfeeding helps to create a very special bond between you and your baby.   Breastfeeding is convenient. Breast milk is always available at the correct temperature and costs nothing.   Breastfeeding helps to burn calories and helps you lose the weight gained during pregnancy.   Breastfeeding makes your uterus contract to its prepregnancy size faster and slows bleeding (lochia) after you give birth.   Breastfeeding helps to lower your risk of developing type 2 diabetes mellitus, osteoporosis, and breast or ovarian cancer later in life. SIGNS THAT YOUR BABY IS HUNGRY Early Signs of Hunger  Increased alertness or activity.  Stretching.  Movement of the head from side to side.  Movement of the head and opening of the mouth when the corner of the mouth or cheek is stroked (rooting).  Increased sucking sounds, smacking lips, cooing, sighing, or squeaking.  Hand-to-mouth movements.  Increased sucking of fingers or hands. Late Signs of Hunger  Fussing.  Intermittent crying. Extreme Signs of Hunger Signs of extreme hunger will require calming and consoling before your baby will be able to breastfeed successfully. Do not wait for the following signs of extreme hunger to occur before you initiate breastfeeding:   Restlessness.  A  loud, strong cry.   Screaming.   BREASTFEEDING BASICS Breastfeeding Initiation  Find a comfortable place to sit or lie down, with your neck and back well supported.  Place a pillow or rolled up blanket under your baby to bring him or her to the level of your breast (if you are seated). Nursing pillows are specially designed to help support your arms and your baby while you breastfeed.  Make sure that your baby's abdomen is facing your abdomen.   Gently massage your breast. With your fingertips, massage from your chest wall toward your nipple in a circular motion. This encourages milk flow. You may need to continue this action during the feeding if your milk flows slowly.  Support your breast with 4 fingers underneath and your thumb above your nipple. Make sure your fingers are well away from your nipple and your baby's mouth.   Stroke your baby's lips gently with your finger or nipple.   When your baby's mouth is open wide enough, quickly bring your baby to your breast, placing your entire nipple and as much of the colored area around your nipple (areola) as possible into your baby's mouth.   More areola should be visible above your baby's upper lip than below the lower lip.   Your baby's tongue should be between his or her lower gum and your breast.   Ensure that your baby's mouth is correctly positioned around your nipple (latched). Your baby's lips should create a seal on your breast and be turned out (everted).  It is common for your baby to suck about 2-3 minutes in order to start the flow of breast milk. Latching Teaching your baby how to latch on to your breast properly is very important. An improper latch can cause nipple pain and decreased milk supply for you and poor weight gain in your baby. Also, if your baby is not latched onto your nipple properly, he or she may swallow some air during feeding. This can make your baby fussy. Burping your baby when you switch breasts  during the feeding can help to get rid of the air. However, teaching your baby to latch on properly is still the best way to prevent fussiness from swallowing air while breastfeeding. Signs that your baby has successfully latched on to your nipple:    Silent tugging or silent sucking, without causing you pain.   Swallowing heard between every 3-4 sucks.    Muscle movement above and in front of his or her ears while sucking.  Signs that your baby has not successfully latched on to nipple:   Sucking sounds or smacking sounds from your baby while breastfeeding.  Nipple pain. If you think your baby has not latched on correctly, slip your finger into the corner of your baby's mouth to break the suction and place it between your baby's gums. Attempt breastfeeding initiation again. Signs of Successful Breastfeeding Signs from your baby:   A gradual decrease in the number of sucks or complete cessation of sucking.   Falling asleep.   Relaxation of his or her body.   Retention of a small amount of milk in his or her mouth.   Letting go of your breast by himself or herself. Signs from you:  Breasts that have increased in firmness, weight, and size 1-3 hours after feeding.   Breasts that are softer immediately after breastfeeding.  Increased milk volume, as well as a change in milk consistency and color by the fifth day of breastfeeding.   Nipples that are not sore, cracked, or bleeding. Signs That Your Pecola Leisure is Getting Enough Milk  Wetting at least 3 diapers in a 24-hour period. The urine should be clear and pale yellow by age 610 days.  At least 3 stools in a 24-hour period by age 610 days. The stool should be soft and yellow.  At least 3 stools in a 24-hour period by age 67 days. The stool should be seedy and yellow.  No loss of weight greater than 10% of birth weight during the first 23 days of age.  Average weight gain of 4-7 ounces (113-198 g) per week after age 61  days.  Consistent daily weight gain by age 610 days, without weight loss after the age of 2 weeks. After a feeding, your baby may spit up a small amount. This is common. BREASTFEEDING FREQUENCY AND DURATION Frequent feeding will help you make more milk and can prevent sore nipples and breast engorgement. Breastfeed when you feel the need to reduce the fullness of your breasts or when your baby shows signs of hunger. This is called "breastfeeding on demand." Avoid introducing a pacifier to your baby while you are working to establish breastfeeding (the first 4-6 weeks after your baby is born). After this time you may choose to use a pacifier. Research has shown that pacifier use during the first year of a baby's life decreases the risk of sudden infant death syndrome (SIDS). Allow your baby to feed on each breast as long as he or she wants. Breastfeed until your baby is finished feeding. When your baby unlatches or falls asleep while feeding from the first breast, offer the second breast. Because newborns are often sleepy in the first few weeks of life, you may need to awaken your baby to get him or her to feed. Breastfeeding times will vary from baby to baby. However, the following rules can serve as a guide to help you ensure that your baby is properly fed:  Newborns (babies 46 weeks of age or younger) may breastfeed every  1-3 hours.  Newborns should not go longer than 3 hours during the day or 5 hours during the night without breastfeeding.  You should breastfeed your baby a minimum of 8 times in a 24-hour period until you begin to introduce solid foods to your baby at around 2 months of age. BREAST MILK PUMPING Pumping and storing breast milk allows you to ensure that your baby is exclusively fed your breast milk, even at times when you are unable to breastfeed. This is especially important if you are going back to work while you are still breastfeeding or when you are not able to be present during  feedings. Your lactation consultant can give you guidelines on how long it is safe to store breast milk.  A breast pump is a machine that allows you to pump milk from your breast into a sterile bottle. The pumped breast milk can then be stored in a refrigerator or freezer. Some breast pumps are operated by hand, while others use electricity. Ask your lactation consultant which type will work best for you. Breast pumps can be purchased, but some hospitals and breastfeeding support groups lease breast pumps on a monthly basis. A lactation consultant can teach you how to hand express breast milk, if you prefer not to use a pump.  CARING FOR YOUR BREASTS WHILE YOU BREASTFEED Nipples can become dry, cracked, and sore while breastfeeding. The following recommendations can help keep your breasts moisturized and healthy:  Avoid using soap on your nipples.   Wear a supportive bra. Although not required, special nursing bras and tank tops are designed to allow access to your breasts for breastfeeding without taking off your entire bra or top. Avoid wearing underwire-style bras or extremely tight bras.  Air dry your nipples for 3-76minutes after each feeding.   Use only cotton bra pads to absorb leaked breast milk. Leaking of breast milk between feedings is normal.   Use lanolin on your nipples after breastfeeding. Lanolin helps to maintain your skin's normal moisture barrier. If you use pure lanolin, you do not need to wash it off before feeding your baby again. Pure lanolin is not toxic to your baby. You may also hand express a few drops of breast milk and gently massage that milk into your nipples and allow the milk to air dry. In the first few weeks after giving birth, some women experience extremely full breasts (engorgement). Engorgement can make your breasts feel heavy, warm, and tender to the touch. Engorgement peaks within 3-5 days after you give birth. The following recommendations can help ease  engorgement:  Completely empty your breasts while breastfeeding or pumping. You may want to start by applying warm, moist heat (in the shower or with warm water-soaked hand towels) just before feeding or pumping. This increases circulation and helps the milk flow. If your baby does not completely empty your breasts while breastfeeding, pump any extra milk after he or she is finished.  Wear a snug bra (nursing or regular) or tank top for 1-2 days to signal your body to slightly decrease milk production.  Apply ice packs to your breasts, unless this is too uncomfortable for you.  Make sure that your baby is latched on and positioned properly while breastfeeding. If engorgement persists after 48 hours of following these recommendations, contact your health care provider or a Advertising copywriter. OVERALL HEALTH CARE RECOMMENDATIONS WHILE BREASTFEEDING  Eat healthy foods. Alternate between meals and snacks, eating 3 of each per day. Because what you eat  affects your breast milk, some of the foods may make your baby more irritable than usual. Avoid eating these foods if you are sure that they are negatively affecting your baby.  Drink milk, fruit juice, and water to satisfy your thirst (about 10 glasses a day).   Rest often, relax, and continue to take your prenatal vitamins to prevent fatigue, stress, and anemia.  Continue breast self-awareness checks.  Avoid chewing and smoking tobacco.  Avoid alcohol and drug use. Some medicines that may be harmful to your baby can pass through breast milk. It is important to ask your health care provider before taking any medicine, including all over-the-counter and prescription medicine as well as vitamin and herbal supplements. It is possible to become pregnant while breastfeeding. If birth control is desired, ask your health care provider about options that will be safe for your baby. SEEK MEDICAL CARE IF:   You feel like you want to stop breastfeeding  or have become frustrated with breastfeeding.  You have painful breasts or nipples.  Your nipples are cracked or bleeding.  Your breasts are red, tender, or warm.  You have a swollen area on either breast.  You have a fever or chills.  You have nausea or vomiting.  You have drainage other than breast milk from your nipples.  Your breasts do not become full before feedings by the fifth day after you give birth.  You feel sad and depressed.  Your baby is too sleepy to eat well.  Your baby is having trouble sleeping.   Your baby is wetting less than 3 diapers in a 24-hour period.  Your baby has less than 3 stools in a 24-hour period.  Your baby's skin or the white part of his or her eyes becomes yellow.   Your baby is not gaining weight by 31 days of age. SEEK IMMEDIATE MEDICAL CARE IF:   Your baby is overly tired (lethargic) and does not want to wake up and feed.  Your baby develops an unexplained fever. Document Released: 04/02/2005 Document Revised: 04/07/2013 Document Reviewed: 09/24/2012 [facility] Patient Information 2015 [place], [place]. This information is not intended to replace advice given to you by your health care provider. Make sure you discuss any questions you have with your health care provider.

## 2014-08-23 LAB — TYPE AND SCREEN
ABO/RH(D): O POS
ANTIBODY SCREEN: POSITIVE
DAT, IGG: NEGATIVE
UNIT DIVISION: 0
UNIT DIVISION: 0

## 2016-03-21 ENCOUNTER — Encounter (HOSPITAL_COMMUNITY): Payer: Self-pay

## 2016-03-21 ENCOUNTER — Emergency Department (HOSPITAL_COMMUNITY)
Admission: EM | Admit: 2016-03-21 | Discharge: 2016-03-21 | Disposition: A | Discharge status: Home / Self Care | Payer: BLUE CROSS/BLUE SHIELD | Attending: Emergency Medicine | Admitting: Emergency Medicine

## 2016-03-21 DIAGNOSIS — R51 Headache: Secondary | ICD-10-CM | POA: Diagnosis not present

## 2016-03-21 DIAGNOSIS — G4452 New daily persistent headache (NDPH): Secondary | ICD-10-CM

## 2016-03-21 MED ORDER — MAGNESIUM SULFATE 2 GM/50ML IV SOLN
2.0000 g | Freq: Once | INTRAVENOUS | Status: AC
  Administered 2016-03-21: 2 g via INTRAVENOUS
  Filled 2016-03-21: qty 50

## 2016-03-21 MED ORDER — KETOROLAC TROMETHAMINE 15 MG/ML IJ SOLN
15.0000 mg | Freq: Once | INTRAMUSCULAR | Status: AC
  Administered 2016-03-21: 15 mg via INTRAVENOUS
  Filled 2016-03-21: qty 1

## 2016-03-21 MED ORDER — METOCLOPRAMIDE HCL 5 MG/ML IJ SOLN
10.0000 mg | Freq: Once | INTRAMUSCULAR | Status: AC
  Administered 2016-03-21: 10 mg via INTRAVENOUS
  Filled 2016-03-21: qty 2

## 2016-03-21 NOTE — ED Notes (Signed)
Pt left AMA.  Took her own IV out after recieving meds

## 2016-03-21 NOTE — ED Triage Notes (Signed)
Patient complains of headache and head pressure x 2 weeks. Has been seen at [facility] for [facility] and then seen again after reported syncopal event. Patient states that the pain is ongoing and had another syncopal event yesterday, alert and oriented, NAD

## 2016-03-21 NOTE — ED Notes (Signed)
Pt left AMA without prior knowledge of her leaving.

## 2016-03-21 NOTE — ED Provider Notes (Signed)
[facility]-EMERGENCY DEPT Provider Note   CSN: [id number] Arrival date & time: 03/21/16  1151  By signing my name below, I, Lori Sanford, attest that this documentation has been prepared under the direction and in the presence of physician practitioner, Nira Conn, MD. Electronically Signed: Linna Sanford, Scribe. 03/21/2016. 1:46 PM.  History   Chief Complaint Chief Complaint  Patient presents with  . Headache    The history is provided by the patient. No language interpreter was used.     HPI Comments: Lori Sanford is a 25 y.o. female who presents to the Emergency Department complaining of sudden onset, constant, headache for the last two weeks. She states she has been having headaches every day since onset and states they present as soon as she wakes up in the morning. She states her headache is unchanged throughout the day. She reports her headache is generalized but most significant frontally. She states her headache is worse with standing or moving her head down. She reports associated lightheadedness, photophobia, and blurry vision. She reports 2 brief syncopal events, one occurring yesterday and one occurring two days ago; she notes both have occurred after urinating and she became very lightheaded prior to losing consciousness. She went to [facility] ER two days ago and was given a migraine cocktail with minimal relief of her headache; she also had a pregnancy test performed which came back negative. Pt reports she recently had a normal menstrual period as well. She reports some burning pain in her eyes beginning yesterday; she was seen by an ophthalmologist yesterday and was told there was swelling behind her eyes. She was subsequently referred to neurology and has not yet been able to schedule an appointment. 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 Pt states she has been hydrating adequately. No family h/o  migraine headaches. She denies rhinorrhea, congestion, phonophobia, fever, chills, cough, nausea, vomiting, diarrhea, dysuria, or any other associated symptoms.  Past Medical History:  Diagnosis Date  . Medical history non-contributory     Patient Active Problem List   Diagnosis Date Noted  . Vaginal delivery 08/20/2014  . Second-degree perineal laceration, with delivery 08/20/2014  . Positive GBS test 08/19/2014  . Headache, infrequent episodic tension-type 04/21/2014    Past Surgical History:  Procedure Laterality Date  . APPENDECTOMY      OB History    Gravida Para Term Preterm AB Living   4 3 3   1 3    SAB TAB Ectopic Multiple Live Births   1     0 3       Home Medications    Prior to Admission medications   Medication Sig Start Date End Date Taking? Authorizing Provider  ferrous sulfate 325 (65 FE) MG tablet Take 1 tablet (325 mg total) by mouth 2 (two) times daily with a meal. 08/21/14   Venus Standard, CNM  ibuprofen (ADVIL,MOTRIN) 600 MG tablet Take 1 tablet (600 mg total) by mouth every 6 (six) hours. 08/21/14   Venus Standard, CNM  oxyCODONE-acetaminophen (PERCOCET/ROXICET) 5-325 MG per tablet Take 1 tablet by mouth every 4 (four) hours as needed (for pain scale 4-7). 08/21/14   Venus Standard, CNM  Prenatal Vit-Fe Fumarate-FA (PRENATAL MULTIVITAMIN) TABS tablet Take 1 tablet by mouth daily at 12 noon.    Historical Provider, MD    Family History Family History  Problem Relation Age of Onset  . Stroke Mother   . Hypertension  Mother   . Diabetes Father     Social History Social History  Substance Use Topics  . Smoking status: Never Smoker  . Smokeless tobacco: Never Used  . Alcohol use No     Allergies   Patient has no known allergies.   Review of Systems Review of Systems  A complete 10 system review of systems was obtained and all systems are negative except as noted in the HPI and PMH.   Physical Exam Updated Vital Signs BP 130/83   Pulse 85    Temp 98.1 F (36.7 C) (Oral)   Resp 18   Ht 5\' 4"  (1.626 m)   Wt 173 lb (78.5 kg)   SpO2 99%   BMI 29.70 kg/m   Physical Exam  Constitutional: She is oriented to person, place, and time. She appears well-developed and well-nourished. No distress.  HENT:  Head: Normocephalic and atraumatic.  Nose: Nose normal.  Eyes: Conjunctivae and EOM are normal. Pupils are equal, round, and reactive to light. Right eye exhibits no discharge. Left eye exhibits no discharge. No scleral icterus.  Fundoscopic exam:      The right eye shows no hemorrhage and no papilledema.       The left eye shows no hemorrhage and no papilledema.  Neck: Normal range of motion. Neck supple.  Cardiovascular: Normal rate and regular rhythm.  Exam reveals no gallop and no friction rub.   No murmur heard. Pulmonary/Chest: Effort normal and breath sounds normal. No stridor. No respiratory distress. She has no rales.  Abdominal: Soft. She exhibits no distension. There is no tenderness.  Musculoskeletal: She exhibits no edema or tenderness.  Neurological: She is alert and oriented to person, place, and time.  Mental Status: Alert and oriented to person, place, and time. Attention and concentration normal. Speech clear. Recent memory is intac  Cranial Nerves  II Visual Fields: Intact to confrontation. Visual fields intact. III, IV, VI: Pupils equal and reactive to light and near. Full eye movement without nystagmus  V Facial Sensation: Normal. No weakness of masticatory muscles  VII: No facial weakness or asymmetry  VIII Auditory Acuity: Grossly normal  IX/X: The uvula is midline; the palate elevates symmetrically  XI: Normal sternocleidomastoid and trapezius strength  XII: The tongue is midline. No atrophy or fasciculations.   Motor System: Muscle Strength: 5/5 and symmetric in the upper and lower extremities. No pronation or drift.  Muscle Tone: Tone and muscle bulk are normal in the upper and lower extremities.     Reflexes: DTRs: 2+ and symmetrical in all four extremities. Plantar responses are flexor bilaterally.  Coordination: Intact finger-to-nose, heel-to-shin, and rapid alternating movements. No tremor.  Sensation: Intact to light touch. Gait: Routine gait normal    Skin: Skin is warm and dry. No rash noted. She is not diaphoretic. No erythema.  Psychiatric: She has a normal mood and affect.  Vitals reviewed.   ED Treatments / Results  Labs (all labs ordered are listed, but only abnormal results are displayed) Labs Reviewed - No data to display  EKG  EKG Interpretation  Date/Time:  Wednesday March 21 2016 13:46:41 EST Ventricular Rate:  64 PR Interval:  150 QRS Duration: 98 QT Interval:  392 QTC Calculation: 404 R Axis:   72 Text Interpretation:  Normal sinus rhythm RSR' or QR pattern in V1 suggests right ventricular conduction delay Borderline ECG Confirmed by WARD,  DO, KRISTEN ([id number]) on 03/22/2016 9:16:29 AM       Radiology No results  found.  Procedures Procedures (including critical care time)  DIAGNOSTIC STUDIES: Oxygen Saturation is 99% on RA, normal by my interpretation.    COORDINATION OF CARE: 2:00 PM Discussed treatment plan with pt at bedside and pt agreed to plan.  Medications Ordered in ED Medications  ketorolac (TORADOL) 15 MG/ML injection 15 mg (15 mg Intravenous Given 03/21/16 1430)  metoCLOPramide (REGLAN) injection 10 mg (10 mg Intravenous Given 03/21/16 1430)  magnesium sulfate IVPB 2 g 50 mL (0 g Intravenous Stopped 03/21/16 1520)     Initial Impression / Assessment and Plan / ED Course  I have reviewed the triage vital signs and the nursing notes.  Pertinent labs & imaging results that were available during my care of the patient were reviewed by me and considered in my medical decision making (see chart for details).  Clinical Course     Non focal neuro exam. No recent head trauma. No fever. Doubt meningitis. Doubt intracranial bleed.  [facility]. No indication for imaging. Will treat with migraine cocktail and reevaluate.  EKG w/o brugada, prolonged QT.  Discussed possible LP to help evaluate for IIH.   Following Tx pt eloped prior to performing LP.   Final Clinical Impressions(s) / ED Diagnoses   Final diagnoses:  New daily persistent headache   I personally performed the services described in this documentation, which was scribed in my presence. The recorded information has been reviewed and is accurate.        Nira ConnPedro Eduardo Deavion Dobbs, MD 03/22/16 1125

## 2016-04-04 ENCOUNTER — Encounter: Payer: Self-pay | Admitting: Neurology

## 2016-04-04 ENCOUNTER — Ambulatory Visit (INDEPENDENT_AMBULATORY_CARE_PROVIDER_SITE_OTHER): Payer: BLUE CROSS/BLUE SHIELD | Admitting: Neurology

## 2016-04-04 VITALS — BP 112/73 | HR 69 | Ht 64.0 in | Wt 179.4 lb

## 2016-04-04 DIAGNOSIS — G08 Intracranial and intraspinal phlebitis and thrombophlebitis: Secondary | ICD-10-CM

## 2016-04-04 DIAGNOSIS — R51 Headache with orthostatic component, not elsewhere classified: Secondary | ICD-10-CM

## 2016-04-04 DIAGNOSIS — H539 Unspecified visual disturbance: Secondary | ICD-10-CM

## 2016-04-04 DIAGNOSIS — H4710 Unspecified papilledema: Secondary | ICD-10-CM | POA: Diagnosis not present

## 2016-04-04 DIAGNOSIS — H05119 Granuloma of unspecified orbit: Secondary | ICD-10-CM | POA: Diagnosis not present

## 2016-04-04 DIAGNOSIS — R519 Headache, unspecified: Secondary | ICD-10-CM

## 2016-04-04 DIAGNOSIS — G932 Benign intracranial hypertension: Secondary | ICD-10-CM

## 2016-04-04 NOTE — Patient Instructions (Signed)
Remember to drink plenty of fluid, eat healthy meals and do not skip any meals. Try to eat protein with a every meal and eat a healthy snack such as fruit or nuts in between meals. Try to keep a regular sleep-wake schedule and try to exercise daily, particularly in the form of walking, 20-30 minutes a day, if you can.   As far as diagnostic testing: MRIs of the brain and a lumbar puncture  I would like to see you back in 8 weeks, sooner if we need to. Please call us with any interim questions, concerns, problems, updates or refill requests.   Our phone number is [phone]. We also have an after hours call service for urgent matters and there is a physician on-call for urgent questions. For any emergencies you know to call 911 or go to the nearest emergency room  Idiopathic Intracranial Hypertension Idiopathic intracranial hypertension (IIH) is a neurologic disorder that leads to increased pressure around your brain. It can cause vision loss and blindness if left untreated. RISK FACTORS IIH is most common in very overweight (obese) women of childbearing age. SIGNS AND SYMPTOMS  Symptoms of IIH include:  Headache.  Feeling of sickness in your stomach (nausea).  Vomiting.  A "rushing of water" sound within your ears (pulsatile tinnitus).  Double vision. DIAGNOSIS  Idiopathic intracranial hypertension is diagnosed with the aid of different exams:  Brain scans such as:  CT.  MRI.  MRV.  Diagnostic lumbar puncture. This procedure can determine if there is too much spinal fluid within the central nervous system. Too much spinal fluid can increase intracranial pressure.  A thorough eye exam will be done to look for swelling within the eyes. Visual field testing will also be done to see if any damage has occurred to nerves in the eyes. TREATMENT  Treatment of idiopathic intracranial hypertension is based on symptoms. Common treatments include:  Lumbar puncture to remove excess  spinal fluid.  Medicine.  Surgery. HOME CARE INSTRUCTIONS The most important thing anyone can do to improve this condition is lose weight if they are overweight.  SEEK MEDICAL CARE IF:  You have changes in vision.  You have double vision.  You have loss of color vision. SEEK IMMEDIATE MEDICAL CARE IF:   Your headaches get worse rather than better.  Nausea or vomiting or both continue after treatment.  Your vision does not improve or gets worse after treatment. MAKE SURE YOU:  Understand these instructions.  Will watch your condition.  Will get help right away if you are not doing well or get worse. This information is not intended to replace advice given to you by your health care provider. Make sure you discuss any questions you have with your health care provider. Document Released: 06/11/2001 Document Revised: 04/07/2013 Document Reviewed: 12/08/2012 Elsevier Interactive Patient Education  2017 [employer]

## 2016-04-04 NOTE — Progress Notes (Signed)
[facility]    Provider:  Dr Lucia GaskinsAhern Referring Provider: Conley RollsLe, [place], [place] Primary Care Physician:  No PCP Per Patient  CC:  Optic Disk Edema   HPI:  Lori MichaelRoxana Sanford is a 25 y.o. female here as a referral from Dr. Conley RollsLe for optic nerve head edema. Past medical history of episodic tension-type headache, normal delivery. Headaches started 4 weeks ago. Feels like pressure in the head all over, tightness of the neck muscles. She wake with headaches in the morning and they worsen and worse by the afternoon every day. She is having vision changes, blurry vision, if she turns too fast she sees black and loses vision in both eyes momentarily, closes and has to refocus, vision is also worse in the mornings. She also has heartbeating in the ears. She is forgetting things. Pressure all over. No weakness or numbing or other focal neurologic dficit. Worse with computer screen and working. Sleeping may help, ibuprofen does not. No snoring. Not excessively tired during the day, not dozing off the day. No light or sound sensitivity, no nausea or vomiting, headaches worse laying down. No inciting events or head trauma, no known triggers, no hx of IUD use, long-term antibiotics, high dose vitamin A.   Reviewed notes, labs and imaging from outside physicians, which showed:  [facility]. 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  RPR NR, HIV neg  Review of Systems: Patient complains of symptoms per HPI as well as the following symptoms: Blurred vision, loss of vision, eye pain, feeling cold,  memory loss, confusion, headache, dizziness, sleepiness. Pertinent negatives per HPI. All others negative.   Social History   Social History  . Marital status: Married    Spouse name: [name]  . Number of children: 3  . Years of education: 5311   Occupational History  . [employer]    Social History Main Topics  . Smoking status: Never Smoker  . Smokeless tobacco: Never Used  . Alcohol use No  . Drug use: No  . Sexual activity: Yes   Other Topics Concern  . Not on file   Social History Narrative   Lives with husband and kids   Caffeine use: 1 or 2 cups daily (Coffee)    Family History  Problem Relation Age of Onset  . Stroke Mother   . Hypertension Mother   . Diabetes Father     Past Medical History:  Diagnosis Date  . Medical history non-contributory     Past Surgical History:  Procedure Laterality Date  . APPENDECTOMY      Current Outpatient Prescriptions  Medication Sig Dispense Refill  . ferrous sulfate 325 (65 FE) MG tablet Take 1 tablet (325 mg total) by mouth 2 (two) times daily with a meal. 60 tablet 3  . ibuprofen (ADVIL,MOTRIN) 600 MG tablet Take 1 tablet (600 mg total) by mouth every 6 (six) hours. 30 tablet 0  . oxyCODONE-acetaminophen (PERCOCET/ROXICET) 5-325 MG per tablet Take 1 tablet by mouth every 4 (four) hours as needed (for pain scale 4-7). 30 tablet 0  . Prenatal Vit-Fe Fumarate-FA (PRENATAL MULTIVITAMIN) TABS tablet Take 1 tablet by mouth daily  at 12 noon.     No current facility-administered medications for this visit.     Allergies as of 04/04/2016  . (No Known Allergies)    Vitals: BP 112/73 (BP Location: Right Arm, Patient Position: Sitting, Cuff Size: Normal)   Pulse 69   Ht 5\' 4"  (1.626 m)   Wt 179 lb 6.4 oz (81.4 kg)   BMI 30.79 kg/m  Last Weight:  Wt Readings from Last 1 Encounters:  04/04/16 179 lb 6.4 oz (81.4 kg)   Last Height:   Ht Readings from Last 1 Encounters:  04/04/16 5\' 4"  (1.626 m)   Physical  exam: Exam: Gen: NAD, conversant, well nourised, obese, well groomed                     CV: RRR, no MRG. No Carotid Bruits. No peripheral edema, warm, nontender Eyes: Conjunctivae clear without exudates or hemorrhage  Neuro: Detailed Neurologic Exam  Speech:    Speech is normal; fluent and spontaneous with normal comprehension.  Cognition:    The patient is oriented to person, place, and time;     recent and remote memory intact;     language fluent;     normal attention, concentration,     fund of knowledge Cranial Nerves:    The pupils are equal, round, and reactive to light. The fundi show mild blurring possibly papilledema. Visual fields are full to finger confrontation. Extraocular movements are intact. Trigeminal sensation is intact and the muscles of mastication are normal. The face is symmetric. The palate elevates in the midline. Hearing intact. Voice is normal. Shoulder shrug is normal. The tongue has normal motion without fasciculations.   Coordination:    Normal finger to nose and heel to shin. Normal rapid alternating movements.   Gait:    Heel-toe and tandem gait are normal.   Motor Observation:    No asymmetry, no atrophy, and no involuntary movements noted. Tone:    Normal muscle tone.    Posture:    Posture is normal. normal erect    Strength:    Strength is V/V in the upper and lower limbs.      Sensation: intact to LT     Reflex Exam:  DTR's:    Deep tendon reflexes in the upper and lower extremities are normal bilaterally.   Toes:    The toes are downgoing bilaterally.   Clonus:    Clonus is absent.       Assessment/Plan:  25 year old with papilledema and headaches, vision changes and obesity need to evaluate for IIH, cerebral venous thrombosis, space-occupying lesions or other causes of increased intracranial pressure or orbital pseudotumor.  MRI brain and orbits and MRV head Lumbar puncture Discussed weight loss and risk of permanent  vision deficits, should symptoms worsen she needs to go directly to the emergency room F/u 8 weeks  Discussed the following:  Idiopathic Intracranial Hypertension Idiopathic intracranial hypertension (IIH) is a neurologic disorder that leads to increased pressure around your brain. It can cause vision loss and blindness if left untreated. RISK FACTORS IIH is most common in very overweight (obese) women of childbearing age. SIGNS AND SYMPTOMS  Symptoms of IIH include:  Headache.  Feeling of sickness in your stomach (nausea).  Vomiting.  A "rushing of water" sound within your ears (pulsatile tinnitus).  Double vision. DIAGNOSIS  Idiopathic intracranial hypertension is diagnosed with the aid of different exams:  Brain scans such as:  CT.  MRI.  MRV.  Diagnostic lumbar puncture. This procedure can determine if there is too much spinal fluid within the central nervous system. Too much spinal fluid can increase intracranial pressure.  A thorough eye exam will be done to look for swelling within the eyes. Visual field testing will also be done to see if any damage has occurred to nerves in the eyes. TREATMENT  Treatment of idiopathic intracranial hypertension is based on symptoms. Common treatments include:  Lumbar puncture to remove excess spinal fluid.  Medicine.  Surgery. HOME CARE INSTRUCTIONS The most important thing anyone can do to improve this condition is lose weight if they are overweight.  SEEK MEDICAL CARE IF:  You have changes in vision.  You have double vision.  You have loss of color vision. SEEK IMMEDIATE MEDICAL CARE IF:   Your headaches get worse rather than better.  Nausea or vomiting or both continue after treatment.  Your vision does not improve or gets worse after treatment. MAKE SURE YOU:  Understand these instructions.  Will watch your condition.  Will get help right away if you are not doing well or get worse. This information is not  intended to replace advice given to you by your health care provider. Make sure you discuss any questions you have with your health care provider. Document Released: 06/11/2001 Document Revised: 04/07/2013 Document Reviewed: 12/08/2012 Elsevier Interactive Patient Education  2017 [employer]  Also discussed: To prevent or relieve headaches, try the following: Cool Compress. Lie down and place a cool compress on your head.  Avoid headache triggers. If certain foods or odors seem to have triggered your migraines in the past, avoid them. A headache diary might help you identify triggers.  Include physical activity in your daily routine. Try a daily walk or other moderate aerobic exercise.  Manage stress. Find healthy ways to cope with the stressors, such as delegating tasks on your to-do list.  Practice relaxation techniques. Try deep breathing, yoga, massage and visualization.  Eat regularly. Eating regularly scheduled meals and maintaining a healthy diet might help prevent headaches. Also, drink plenty of fluids.  Follow a regular sleep schedule. Sleep deprivation might contribute to headaches Consider biofeedback. With this mind-body technique, you learn to control certain bodily functions - such as muscle tension, heart rate and blood pressure - to prevent headaches or reduce headache pain.    Proceed to emergency room if you experience new or worsening symptoms or symptoms do not resolve, if you have new neurologic symptoms or if headache is severe, or for any concerning symptom.   Cc:  Dr. My Micah NoelLe    Antonia Ahern, MD  [facility] [address] [place], [place] [address]  Phone [phone] Fax [phone]

## 2016-04-08 DIAGNOSIS — G932 Benign intracranial hypertension: Secondary | ICD-10-CM | POA: Insufficient documentation

## 2016-04-11 ENCOUNTER — Encounter: Payer: Self-pay | Admitting: *Deleted

## 2016-04-11 NOTE — Progress Notes (Signed)
Faxed AA,MD office note to My Conley RollsLe, [place] per AA,MD request. Fax: [phone]. Received confirmation.

## 2016-04-17 ENCOUNTER — Other Ambulatory Visit: Payer: Self-pay | Admitting: Internal Medicine

## 2016-04-18 ENCOUNTER — Other Ambulatory Visit: Payer: BLUE CROSS/BLUE SHIELD

## 2016-04-20 ENCOUNTER — Encounter: Payer: Self-pay | Admitting: Radiology

## 2016-04-20 ENCOUNTER — Ambulatory Visit
Admission: RE | Admit: 2016-04-20 | Discharge: 2016-04-20 | Disposition: A | Discharge status: Home / Self Care | Payer: BLUE CROSS/BLUE SHIELD | Source: Ambulatory Visit | Attending: Neurology | Admitting: Neurology

## 2016-04-20 ENCOUNTER — Telehealth: Payer: Self-pay | Admitting: *Deleted

## 2016-04-20 VITALS — BP 115/65 | HR 55

## 2016-04-20 DIAGNOSIS — H4710 Unspecified papilledema: Secondary | ICD-10-CM

## 2016-04-20 DIAGNOSIS — G44219 Episodic tension-type headache, not intractable: Secondary | ICD-10-CM

## 2016-04-20 DIAGNOSIS — H05119 Granuloma of unspecified orbit: Secondary | ICD-10-CM

## 2016-04-20 DIAGNOSIS — H539 Unspecified visual disturbance: Secondary | ICD-10-CM

## 2016-04-20 DIAGNOSIS — R51 Headache with orthostatic component, not elsewhere classified: Secondary | ICD-10-CM

## 2016-04-20 DIAGNOSIS — R519 Headache, unspecified: Secondary | ICD-10-CM

## 2016-04-20 DIAGNOSIS — G08 Intracranial and intraspinal phlebitis and thrombophlebitis: Secondary | ICD-10-CM

## 2016-04-20 LAB — PROTEIN, CSF: TOTAL PROTEIN, CSF: 22 mg/dL (ref 15–45)

## 2016-04-20 LAB — CSF CELL COUNT WITH DIFFERENTIAL
RBC COUNT CSF: 1 {cells}/uL (ref 0–10)
WBC, CSF: 0 cells/uL (ref 0–5)

## 2016-04-20 LAB — GLUCOSE, CSF: Glucose, CSF: 55 mg/dL (ref 43–76)

## 2016-04-20 NOTE — Telephone Encounter (Signed)
-----   Message from Anson FretAntonia B Ahern, MD sent at 04/20/2016  1:27 PM EST ----- The opening pressure was only slightly elevated. 20 is normal, hers was 22 which is only minimally elevated and not significantly abnormal. At this point we need to see what the MRIs show, will call her after reviewing

## 2016-04-20 NOTE — Discharge Instructions (Signed)

## 2016-04-20 NOTE — Telephone Encounter (Signed)
LVM for pt to call about results. Gave [facility] phone number.  

## 2016-04-23 ENCOUNTER — Telehealth: Payer: Self-pay | Admitting: *Deleted

## 2016-04-23 NOTE — Telephone Encounter (Signed)
Pt called office back about results. Relayed results per AA,MD note about LP. Verified she has MRI's on 04/25/16. Advised we will call her about results when they come back. She verbalized understanding.   

## 2016-04-23 NOTE — Telephone Encounter (Signed)
Pataint is returning a call regarding lumbar puncture results.

## 2016-04-23 NOTE — Telephone Encounter (Signed)
Pt called office back about results. Relayed results per AA,MD note about LP. Verified she has MRI's on 04/25/16. Advised we will call her about results when they come back. She verbalized understanding.

## 2016-04-23 NOTE — Telephone Encounter (Signed)
LVM for pt to call back. Returning her call. Gave [facility] phone number.  

## 2016-04-25 ENCOUNTER — Other Ambulatory Visit: Payer: BLUE CROSS/BLUE SHIELD

## 2016-04-27 ENCOUNTER — Other Ambulatory Visit: Payer: BLUE CROSS/BLUE SHIELD

## 2016-05-03 ENCOUNTER — Other Ambulatory Visit: Payer: BLUE CROSS/BLUE SHIELD

## 2016-06-05 ENCOUNTER — Ambulatory Visit: Payer: BLUE CROSS/BLUE SHIELD | Admitting: Neurology

## 2016-08-21 ENCOUNTER — Inpatient Hospital Stay: Admission: RE | Admit: 2016-08-21 | Payer: BLUE CROSS/BLUE SHIELD | Source: Ambulatory Visit

## 2016-08-21 ENCOUNTER — Other Ambulatory Visit: Payer: BLUE CROSS/BLUE SHIELD

## 2016-11-12 ENCOUNTER — Other Ambulatory Visit: Payer: BLUE CROSS/BLUE SHIELD

## 2016-12-30 ENCOUNTER — Emergency Department (HOSPITAL_COMMUNITY): Payer: BLUE CROSS/BLUE SHIELD

## 2016-12-30 ENCOUNTER — Emergency Department (HOSPITAL_COMMUNITY)
Admission: EM | Admit: 2016-12-30 | Discharge: 2016-12-30 | Disposition: A | Discharge status: Home / Self Care | Payer: BLUE CROSS/BLUE SHIELD | Attending: Emergency Medicine | Admitting: Emergency Medicine

## 2016-12-30 ENCOUNTER — Encounter (HOSPITAL_COMMUNITY): Payer: Self-pay | Admitting: Emergency Medicine

## 2016-12-30 DIAGNOSIS — G43109 Migraine with aura, not intractable, without status migrainosus: Secondary | ICD-10-CM | POA: Diagnosis not present

## 2016-12-30 DIAGNOSIS — R51 Headache: Secondary | ICD-10-CM | POA: Diagnosis present

## 2016-12-30 LAB — I-STAT CHEM 8, ED
BUN: 4 mg/dL — ABNORMAL LOW (ref 6–20)
CALCIUM ION: 1.12 mmol/L — AB (ref 1.15–1.40)
CREATININE: 0.6 mg/dL (ref 0.44–1.00)
Chloride: 106 mmol/L (ref 101–111)
GLUCOSE: 83 mg/dL (ref 65–99)
HCT: 34 % — ABNORMAL LOW (ref 36.0–46.0)
HEMOGLOBIN: 11.6 g/dL — AB (ref 12.0–15.0)
Potassium: 3.4 mmol/L — ABNORMAL LOW (ref 3.5–5.1)
Sodium: 141 mmol/L (ref 135–145)
TCO2: 24 mmol/L (ref 22–32)

## 2016-12-30 LAB — I-STAT BETA HCG BLOOD, ED (MC, WL, AP ONLY)

## 2016-12-30 MED ORDER — POTASSIUM CHLORIDE CRYS ER 20 MEQ PO TBCR
40.0000 meq | EXTENDED_RELEASE_TABLET | Freq: Once | ORAL | Status: AC
  Administered 2016-12-30: 40 meq via ORAL
  Filled 2016-12-30: qty 2

## 2016-12-30 MED ORDER — SODIUM CHLORIDE 0.9 % IV BOLUS (SEPSIS)
1000.0000 mL | Freq: Once | INTRAVENOUS | Status: AC
  Administered 2016-12-30: 1000 mL via INTRAVENOUS

## 2016-12-30 MED ORDER — DEXAMETHASONE SODIUM PHOSPHATE 10 MG/ML IJ SOLN
10.0000 mg | Freq: Once | INTRAMUSCULAR | Status: AC
  Administered 2016-12-30: 10 mg via INTRAVENOUS
  Filled 2016-12-30: qty 1

## 2016-12-30 MED ORDER — BUTALBITAL-APAP-CAFFEINE 50-325-40 MG PO TABS: 1.0000 | ORAL_TABLET | Freq: Four times a day (QID) | ORAL | 0 refills | Status: AC | PRN

## 2016-12-30 MED ORDER — KETOROLAC TROMETHAMINE 15 MG/ML IJ SOLN
15.0000 mg | Freq: Once | INTRAMUSCULAR | Status: AC
  Administered 2016-12-30: 15 mg via INTRAVENOUS
  Filled 2016-12-30: qty 1

## 2016-12-30 MED ORDER — DIPHENHYDRAMINE HCL 50 MG/ML IJ SOLN
25.0000 mg | Freq: Once | INTRAMUSCULAR | Status: AC
  Administered 2016-12-30: 25 mg via INTRAVENOUS
  Filled 2016-12-30: qty 1

## 2016-12-30 MED ORDER — PROCHLORPERAZINE EDISYLATE 5 MG/ML IJ SOLN
10.0000 mg | Freq: Once | INTRAMUSCULAR | Status: AC
  Administered 2016-12-30: 10 mg via INTRAVENOUS
  Filled 2016-12-30: qty 2

## 2016-12-30 NOTE — ED Notes (Signed)
Ha started Monday, left sided face numbness started today, no nausea, no vomiting, no fever

## 2016-12-30 NOTE — ED Triage Notes (Signed)
Patient c/o headache with light sensitivity x5 days. Patient also reports tingling to left side of face since 1100 this morning. Ambulatory to triage.

## 2016-12-30 NOTE — ED Provider Notes (Signed)
[facility]-EMERGENCY DEPT Provider Note   CSN: [id number] Arrival date & time: 12/30/16  1714     History   Chief Complaint Chief Complaint  Patient presents with  . Headache    HPI Lori Sanford is a 26 y.o. female.  HPI   26 yo F with PMHx of IIH, migraines, here with headache x 5 days. Pt reports that over the past 5 days she has had an aching, throbbing, generalized headache. This headache feels similar to her previous migraines. She does report a history of intracranial hypertension but per review, open pressure was 22 only. She is not on any medications for this. She strongly denies any vision changes or vomiting. Headache is aching, throbbing, and worse with bright light and loud noises. Denies any alleviating factors. No fevers or chills. No recent trauma or falls. She is not on blood thinners. Denies any neck pain or stiffness.  Of note, pt also reports "tingling" in her left face today. No persistent numbness. No facial weakness. No difficulty speaking or swallowing.  Past Medical History:  Diagnosis Date  . Medical history non-contributory     Patient Active Problem List   Diagnosis Date Noted  . IIH (idiopathic intracranial hypertension) 04/08/2016  . Vaginal delivery 08/20/2014  . Second-degree perineal laceration, with delivery 08/20/2014  . Positive GBS test 08/19/2014  . Headache, infrequent episodic tension-type 04/21/2014    Past Surgical History:  Procedure Laterality Date  . APPENDECTOMY      OB History    Gravida Para Term Preterm AB Living   SAB TAB Ectopic Multiple Live Births   1     0 3       Home Medications    Prior to Admission medications   Medication Sig Start Date End Date Taking? Authorizing Provider  butalbital-acetaminophen-caffeine (FIORICET, ESGIC) [phone] MG tablet Take 1-2 tablets by mouth every 6 (six) hours as needed for headache. 12/30/16 12/30/17  Shaune Pollack, MD  ferrous sulfate 325 (65 FE) MG tablet Take  1 tablet (325 mg total) by mouth 2 (two) times daily with a meal. 08/21/14   Standard, Venus, CNM  ibuprofen (ADVIL,MOTRIN) 600 MG tablet Take 1 tablet (600 mg total) by mouth every 6 (six) hours. 08/21/14   Standard, Venus, CNM  oxyCODONE-acetaminophen (PERCOCET/ROXICET) 5-325 MG per tablet Take 1 tablet by mouth every 4 (four) hours as needed (for pain scale 4-7). 08/21/14   Standard, Venus, CNM  Prenatal Vit-Fe Fumarate-FA (PRENATAL MULTIVITAMIN) TABS tablet Take 1 tablet by mouth daily at 12 noon.    [provider]    Family History Family History  Problem Relation Age of Onset  . Stroke Mother   . Hypertension Mother   . Diabetes Father     Social History Social History  Substance Use Topics  . Smoking status: Never Smoker  . Smokeless tobacco: Never Used  . Alcohol use No     Allergies   Patient has no known allergies.   Review of Systems Review of Systems  Constitutional: Positive for fatigue. Negative for chills and fever.  HENT: Negative for congestion, rhinorrhea and sore throat.   Eyes: Positive for photophobia. Negative for visual disturbance.  Respiratory: Negative for cough, shortness of breath and wheezing.   Cardiovascular: Negative for chest pain and leg swelling.  Gastrointestinal: Negative for abdominal pain, diarrhea, nausea and vomiting.  Genitourinary: Negative for dysuria, flank pain, vaginal bleeding and vaginal discharge.  Musculoskeletal: Negative for neck  pain.  Skin: Negative for rash.  Allergic/Immunologic: Negative for immunocompromised state.  Neurological: Positive for headaches. Negative for syncope.  Hematological: Does not bruise/bleed easily.  All other systems reviewed and are negative.    Physical Exam Updated Vital Signs BP 120/88 (BP Location: Right Arm)   Pulse 68   Temp 98 F (36.7 C) (Oral)   Resp 18   LMP 12/23/2016   SpO2 100%   Physical Exam  Constitutional: She is oriented to person, place, and time. She  appears well-developed and well-nourished. No distress.  HENT:  Head: Normocephalic and atraumatic.  Eyes: Conjunctivae are normal.  Neck: Neck supple.  Cardiovascular: Normal rate, regular rhythm and normal heart sounds.  Exam reveals no friction rub.   No murmur heard. Pulmonary/Chest: Effort normal and breath sounds normal. No respiratory distress. She has no wheezes. She has no rales.  Abdominal: She exhibits no distension.  Musculoskeletal: She exhibits no edema.  Neurological: She is alert and oriented to person, place, and time. She exhibits normal muscle tone.  Skin: Skin is warm. Capillary refill takes less than 2 seconds.  Psychiatric: She has a normal mood and affect.  Nursing note and vitals reviewed.   Neurological Exam:  Mental Status: Alert and oriented to person, place, and time. Attention and concentration normal. Speech clear. Recent memory is intact. Cranial Nerves: Visual fields grossly intact. EOMI and PERRLA. No nystagmus noted. Facial sensation intact at forehead, maxillary cheek, and chin/mandible bilaterally. No facial asymmetry or weakness. Hearing grossly normal. Uvula is midline, and palate elevates symmetrically. Normal SCM and trapezius strength. Tongue midline without fasciculations. Motor: Muscle strength 5/5 in proximal and distal UE and LE bilaterally. No pronator drift. Muscle tone normal. Reflexes: 2+ and symmetrical in all four extremities.  Sensation: Intact to light touch in upper and lower extremities distally bilaterally.  Gait: Normal without ataxia. Coordination: Normal FTN bilaterally.    ED Treatments / Results  Labs (all labs ordered are listed, but only abnormal results are displayed) Labs Reviewed  I-STAT CHEM 8, ED - Abnormal; Notable for the following:       Result Value   Potassium 3.4 (*)    BUN 4 (*)    Calcium, Ion 1.12 (*)    Hemoglobin 11.6 (*)    HCT 34.0 (*)    All other components within normal limits  I-STAT BETA HCG  BLOOD, ED ([facility], [facility], [facility] ONLY)    EKG  EKG Interpretation None       Radiology Ct Head Wo Contrast  Result Date: 12/30/2016 CLINICAL DATA:  Headache in light sensitivity x5 days. EXAM: CT HEAD WITHOUT CONTRAST TECHNIQUE: Contiguous axial images were obtained from the base of the skull through the vertex without intravenous contrast. COMPARISON:  None. FINDINGS: Brain: No evidence of acute infarction, hemorrhage, hydrocephalus, extra-axial collection or mass lesion/mass effect. Vascular: No hyperdense vessel or unexpected calcification. Skull: Normal. Negative for fracture or focal lesion. Sinuses/Orbits: No acute finding. Other: None IMPRESSION: No acute intracranial abnormality. Electronically Signed   By: Tollie Eth M.D.   On: 12/30/2016 21:42    Procedures Procedures (including critical care time)  Medications Ordered in ED Medications  prochlorperazine (COMPAZINE) injection 10 mg (10 mg Intravenous Given 12/30/16 2217)  sodium chloride 0.9 % bolus 1,000 mL (0 mLs Intravenous Stopped 12/30/16 2318)  diphenhydrAMINE (BENADRYL) injection 25 mg (25 mg Intravenous Given 12/30/16 2217)  ketorolac (TORADOL) 15 MG/ML injection 15 mg (15 mg Intravenous Given 12/30/16 2217)  dexamethasone (DECADRON) injection 10  mg (10 mg Intravenous Given 12/30/16 2217)  potassium chloride SA (K-DUR,KLOR-CON) CR tablet 40 mEq (40 mEq Oral Given 12/30/16 2317)     Initial Impression / Assessment and Plan / ED Course  I have reviewed the triage vital signs and the nursing notes.  Pertinent labs & imaging results that were available during my care of the patient were reviewed by me and considered in my medical decision making (see chart for details).     25 yo F with h/o chronic migraines, questionable IIH here with hA x 4 days. No vision changes. Suspect acute on chronic migraine, likely 2/2 recent stressors. CT head neg (has never had any imaging so this was obtained to eval mass effect, hydrocephalus,  lesion). She feels markedly improved after migraine cocktail. She has no red flags - no fever, neck stiffness, or s/s meningitis or encephalitis. She initially reported possible facial numbness but this comes and goes, is likely related to complex migraine and she has no risk factors for CVA, with normal neuro exam at this time. She has no vision changes, vomiting, gait disturbances, and OP was only 22 during much more severe HA - doubt severe IIH. Will refer back to neuro for outpt follow-up. I discussed importance of following up and outpt MRI, which had been ordered by Neuro but pt did not pursue.  Final Clinical Impressions(s) / ED Diagnoses   Final diagnoses:  Migraine with aura and without status migrainosus, not intractable    New Prescriptions Discharge Medication List as of 12/30/2016 11:16 PM       Shaune Pollack, MD 12/31/16 0211

## 2017-01-03 ENCOUNTER — Encounter (HOSPITAL_COMMUNITY): Payer: Self-pay | Admitting: Emergency Medicine

## 2017-01-03 ENCOUNTER — Emergency Department (HOSPITAL_COMMUNITY)
Admission: EM | Admit: 2017-01-03 | Discharge: 2017-01-04 | Disposition: A | Discharge status: Home / Self Care | Payer: BLUE CROSS/BLUE SHIELD | Attending: Emergency Medicine | Admitting: Emergency Medicine

## 2017-01-03 DIAGNOSIS — Z5321 Procedure and treatment not carried out due to patient leaving prior to being seen by health care provider: Secondary | ICD-10-CM | POA: Insufficient documentation

## 2017-01-03 DIAGNOSIS — R51 Headache: Secondary | ICD-10-CM | POA: Diagnosis present

## 2017-01-03 LAB — COMPREHENSIVE METABOLIC PANEL
ALK PHOS: 81 U/L (ref 38–126)
ALT: 21 U/L (ref 14–54)
ANION GAP: 9 (ref 5–15)
AST: 21 U/L (ref 15–41)
Albumin: 4.3 g/dL (ref 3.5–5.0)
BUN: 11 mg/dL (ref 6–20)
CALCIUM: 8.8 mg/dL — AB (ref 8.9–10.3)
CO2: 23 mmol/L (ref 22–32)
Chloride: 105 mmol/L (ref 101–111)
Creatinine, Ser: 0.75 mg/dL (ref 0.44–1.00)
GFR calc non Af Amer: >60 mL/min (ref >60)
GLUCOSE: 88 mg/dL (ref 65–99)
POTASSIUM: 3.4 mmol/L — AB (ref 3.5–5.1)
Sodium: 137 mmol/L (ref 135–145)
Total Bilirubin: 0.5 mg/dL (ref 0.3–1.2)
Total Protein: 7.5 g/dL (ref 6.5–8.1)

## 2017-01-03 LAB — CBC
HEMATOCRIT: 33.6 % — AB (ref 36.0–46.0)
HEMOGLOBIN: 11.5 g/dL — AB (ref 12.0–15.0)
MCH: 29.1 pg (ref 26.0–34.0)
MCHC: 34.2 g/dL (ref 30.0–36.0)
MCV: 85.1 fL (ref 78.0–100.0)
Platelets: 354 10*3/uL (ref 150–400)
RBC: 3.95 MIL/uL (ref 3.87–5.11)
RDW: 13 % (ref 11.5–15.5)
WBC: 7 10*3/uL (ref 4.0–10.5)

## 2017-01-03 LAB — CBG MONITORING, ED: Glucose-Capillary: 107 mg/dL — ABNORMAL HIGH (ref 65–99)

## 2017-01-03 LAB — I-STAT BETA HCG BLOOD, ED (MC, WL, AP ONLY)

## 2017-01-03 LAB — LIPASE, BLOOD: LIPASE: 25 U/L (ref 11–51)

## 2017-01-03 NOTE — ED Triage Notes (Signed)
Pt complaint of ongoing headache for 2 weeks, new onset n/v/d for a few days, and episode of "passing out" today.

## 2017-01-03 NOTE — ED Notes (Signed)
Pt called for v/s recheck, no response from lobby 

## 2017-08-18 IMAGING — XA DG FLUORO GUIDE SPINAL/SI JT INJ*R*
1 series · 2 of 2 positions shown · non-contrast
Comparison: none

CLINICAL DATA: Headache and visual changes. Papilledema. Evaluation
for idiopathic intracranial hypertension.

[Series 1: ortho standard · 2 of 2 slices shown]
[im 1/2]
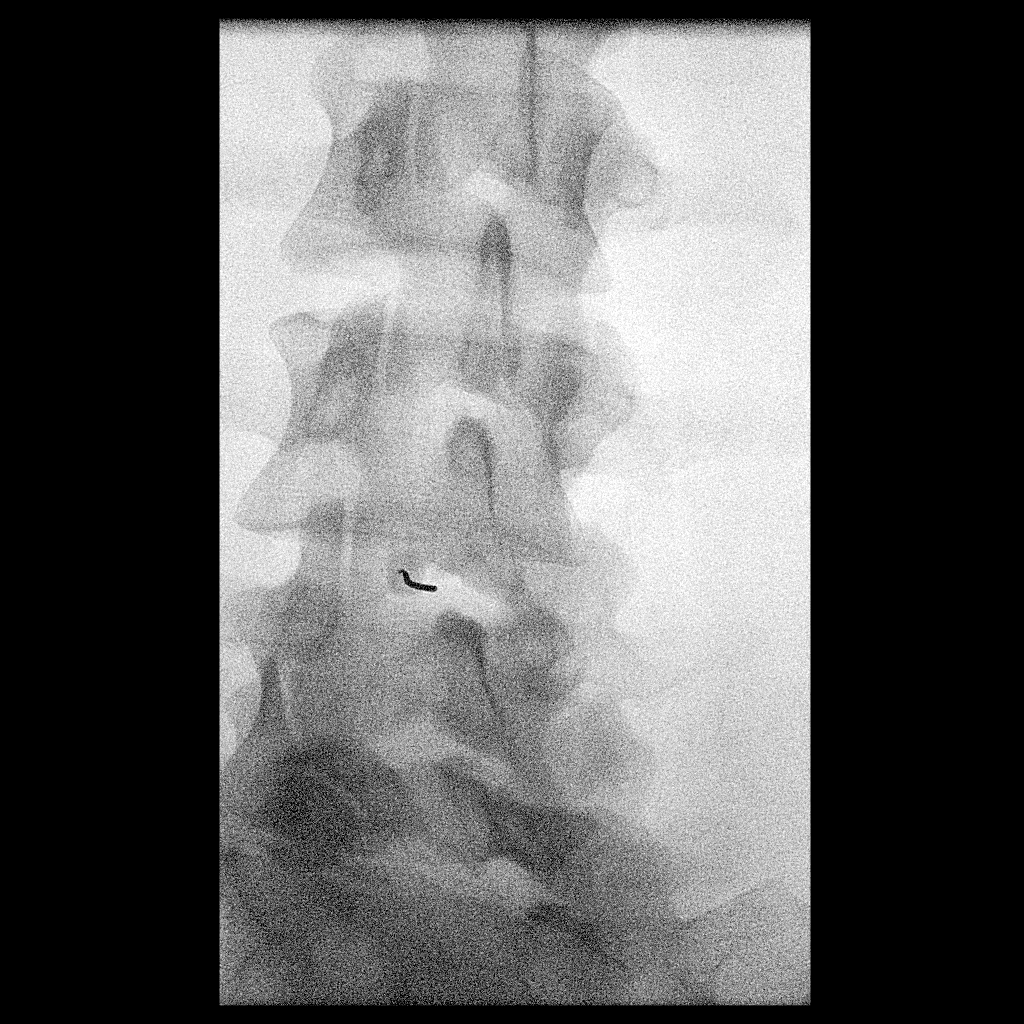
[im 2/2]
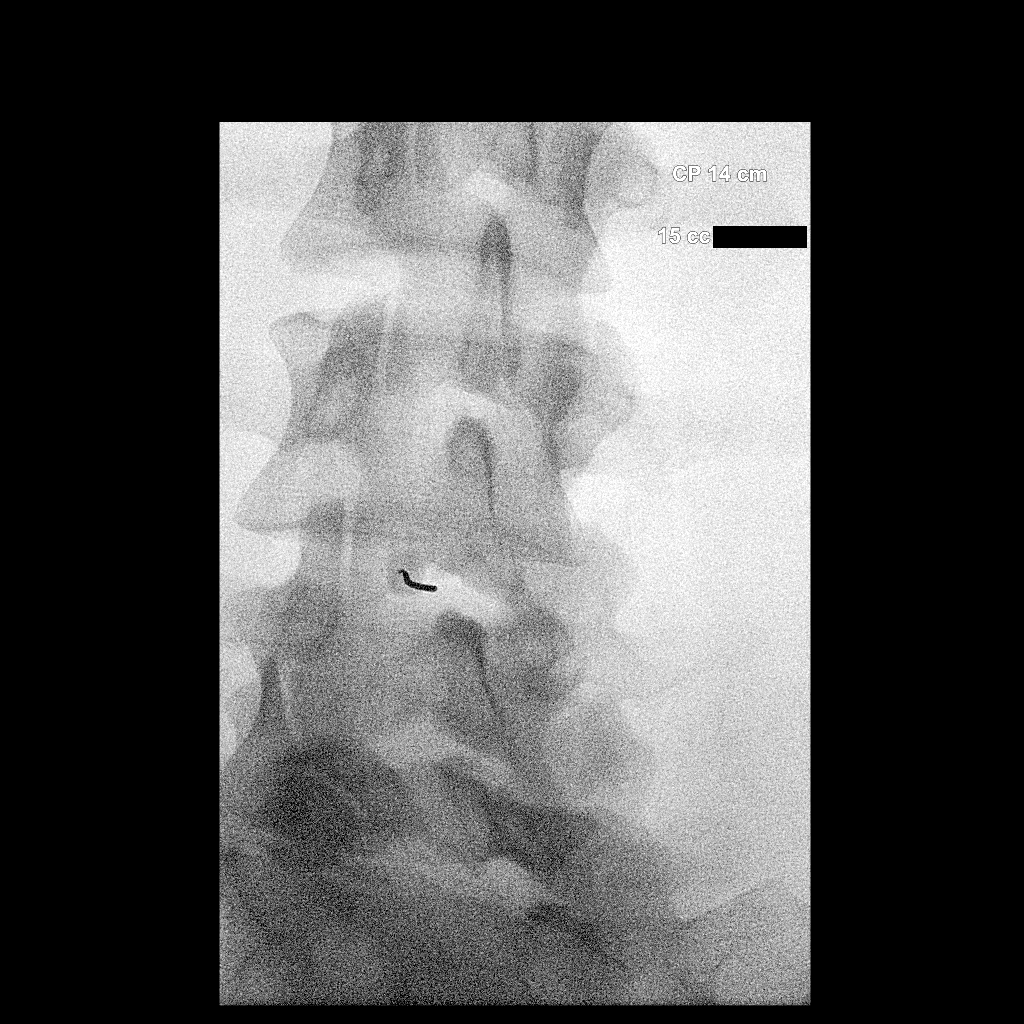

[2 of 2 positions shown; findings below may reference images not displayed]

EXAM:
DIAGNOSTIC LUMBAR PUNCTURE UNDER FLUOROSCOPIC GUIDANCE

FLUOROSCOPY TIME:  Radiation Exposure Index (as provided by the
fluoroscopic device): 11.78 microGray*m^2

Fluoroscopy Time (in minutes and seconds):  2 seconds

Number of Acquired Images:  0

PROCEDURE:
Informed consent was obtained from the patient prior to the
procedure, including potential complications of headache, allergy,
and pain. With the patient prone, the lower back was prepped with
Betadine. 1% Lidocaine was used for local anesthesia. Lumbar
puncture was performed at the L3-4 level using a 3.5 inch 20 gauge
needle via a left interlaminar approach with return of clear CSF
with an opening pressure of 22 cm water (measured in the left
lateral decubitus position). 15 ml of CSF were obtained for
laboratory studies. Closing pressure was 14 cm water. The patient
tolerated the procedure well and there were no apparent
complications.
IMPRESSION: Successful fluoroscopic guided lumbar puncture. Opening pressure of
22 cm water.

## 2018-04-29 IMAGING — CT CT HEAD W/O CM
3 of 4 series · 14 of 47 positions shown, 16 images · non-contrast
Comparison: None.

CLINICAL DATA: Headache in light sensitivity x5 days.

EXAM:
CT HEAD WITHOUT CONTRAST
TECHNIQUE: Contiguous axial images were obtained from the base of the skull
through the vertex without intravenous contrast.

[Series 2: head w/o · axial · non-contrast · 0.45mm/px · z∈[+1564,+1690]mm · 8 of 31 slices shown, 10 images]
[im 3/31  brain]
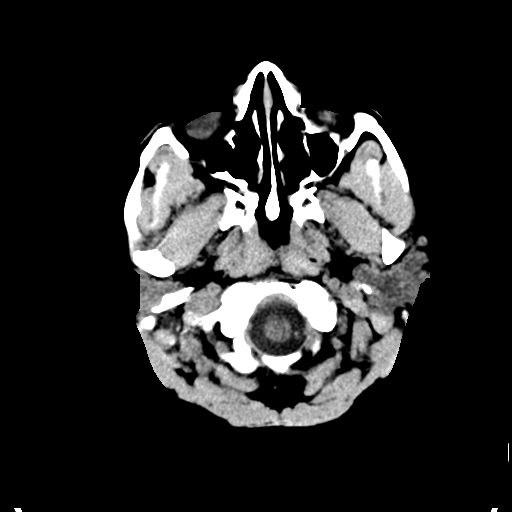
[im 3/31  bone]
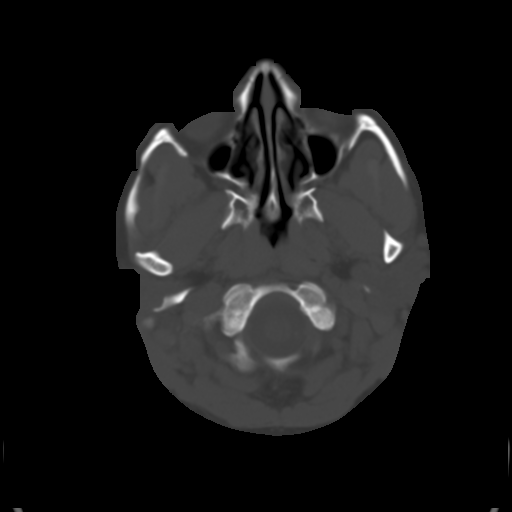
[im 7/31  brain]
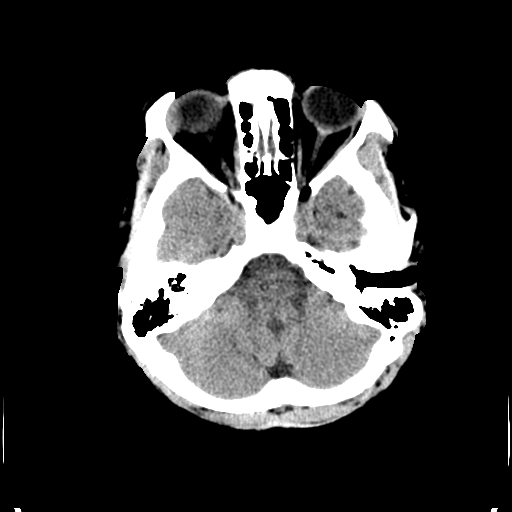
[im 11/31  brain]
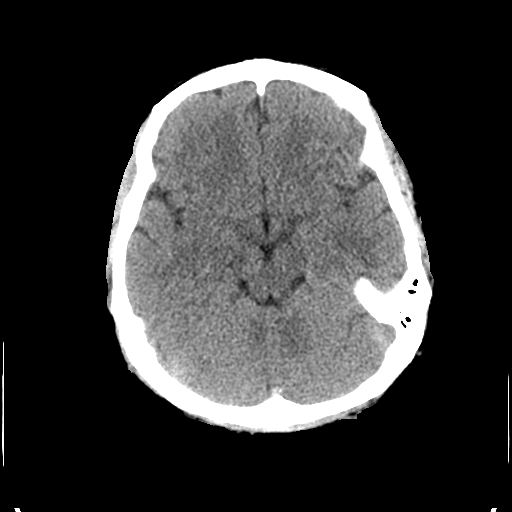
[im 13/31  brain]
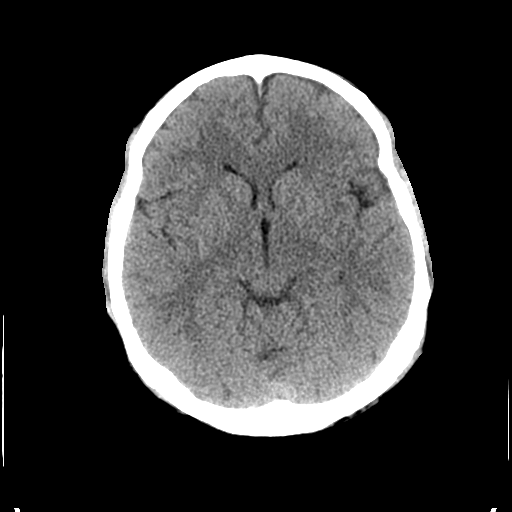
[im 18/31  brain]
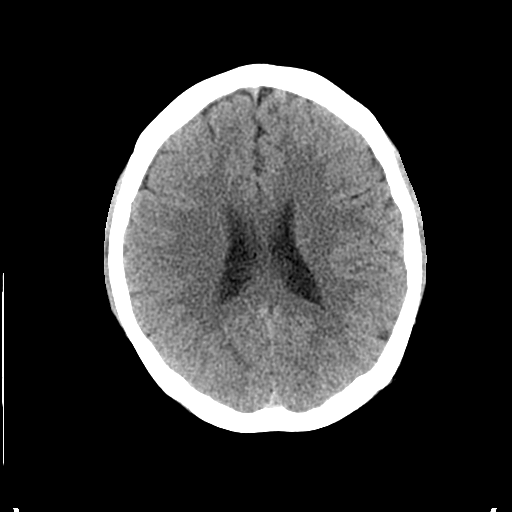
[im 18/31  bone]
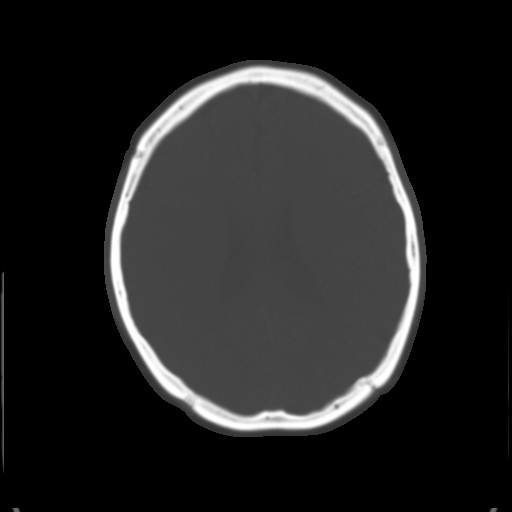
[im 20/31  brain]
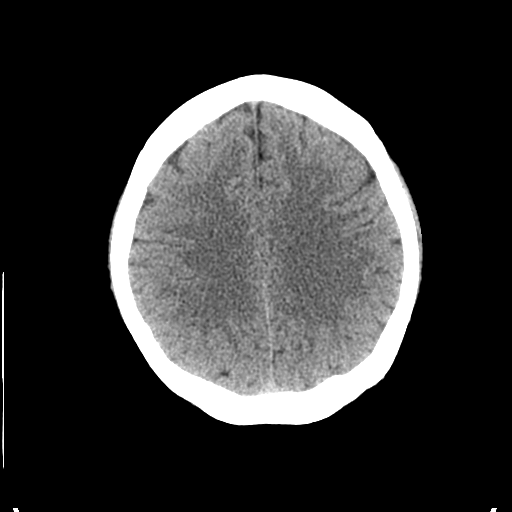
[im 24/31  brain]
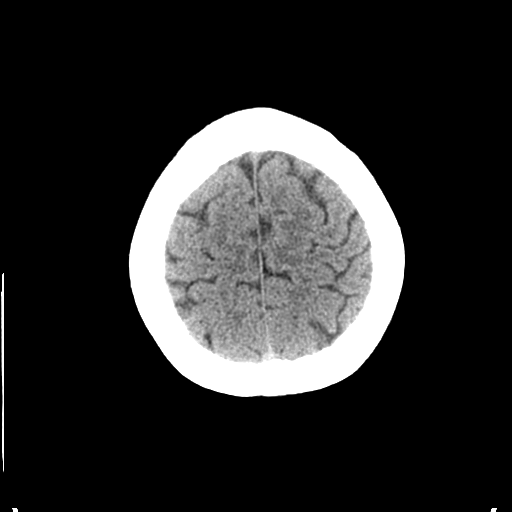
[im 28/31  brain]
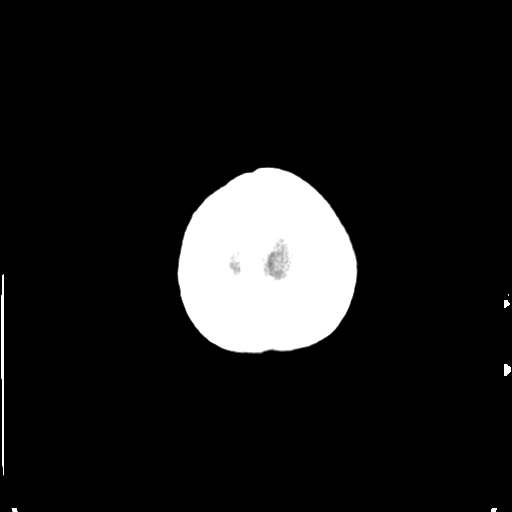

[Series 4: coronal · coronal · 0.30mm/px · 3 of 70 slices shown]
[im 24/70  brain]
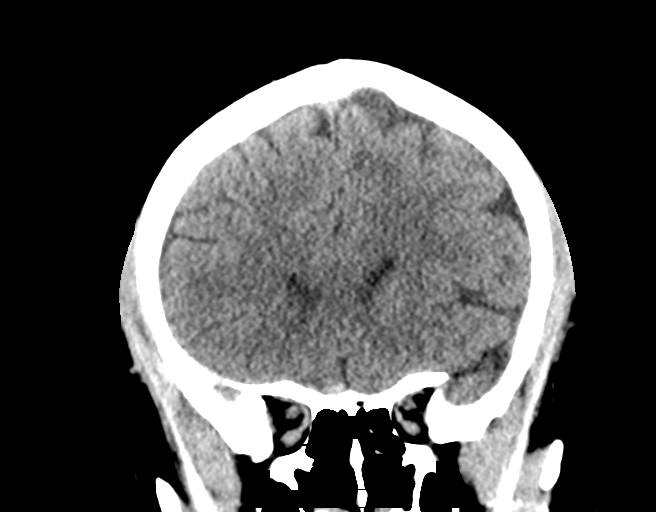
[im 31/70  brain]
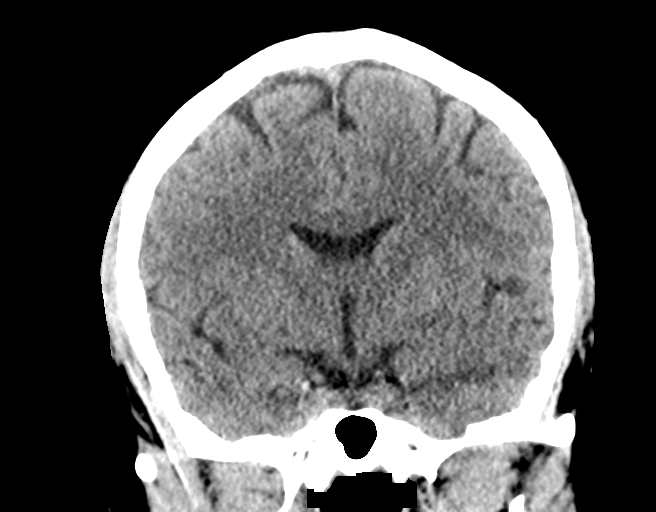
[im 39/70  brain]
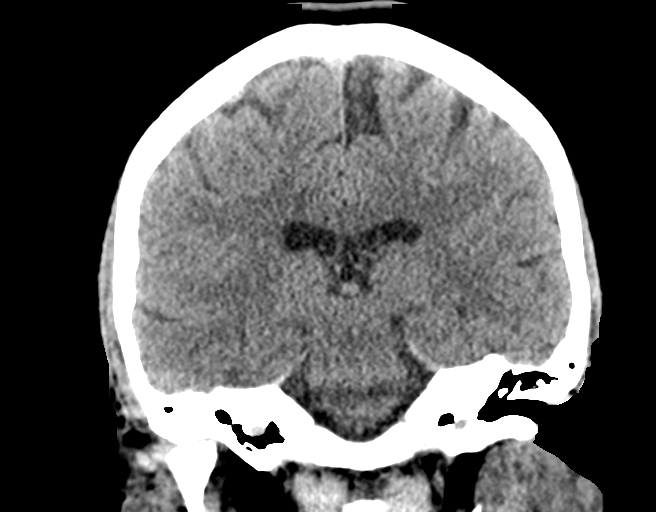

[Series 5: sagittal · sagittal · 0.30mm/px · 3 of 77 slices shown]
[im 26/77  brain]
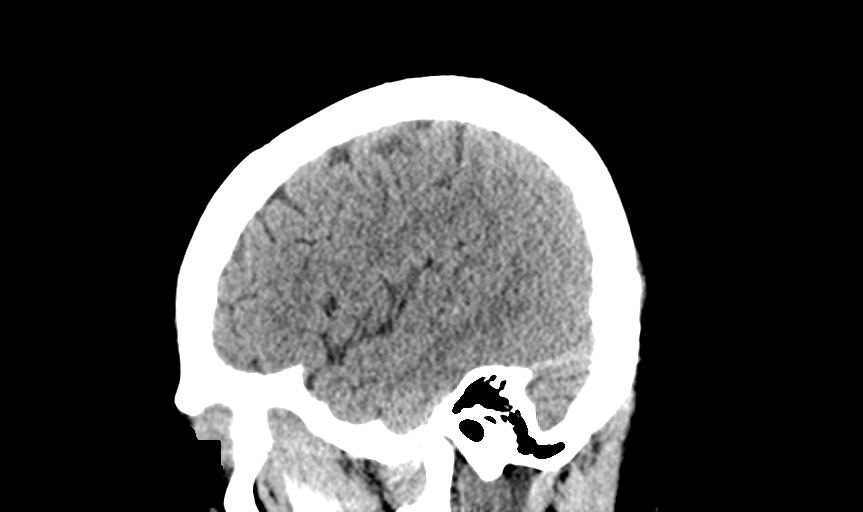
[im 39/77  brain]
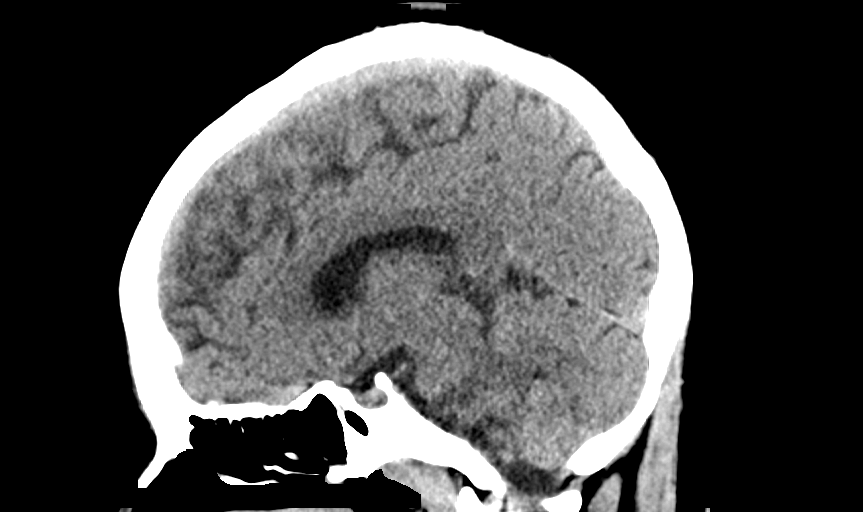
[im 51/77  brain]
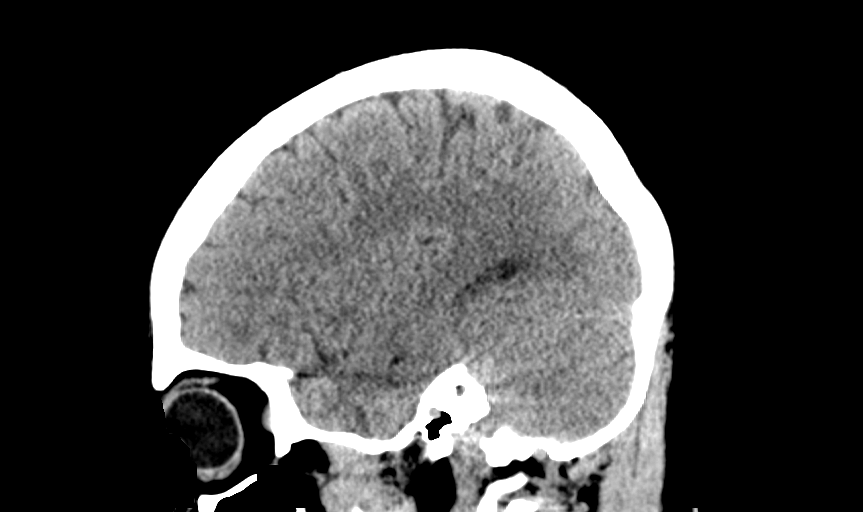

[14 of 47 positions shown; findings below may reference images not displayed]

FINDINGS: Brain: No evidence of acute infarction, hemorrhage, hydrocephalus,
extra-axial collection or mass lesion/mass effect.

Vascular: No hyperdense vessel or unexpected calcification.

Skull: Normal. Negative for fracture or focal lesion.

Sinuses/Orbits: No acute finding.

Other: None
IMPRESSION: No acute intracranial abnormality.
